# Patient Record
Sex: Female | Born: 1965 | Race: Black or African American | Hispanic: No | Marital: Single | Smoking: Never smoker
Health system: Southern US, Community
[De-identification: ages and names within clinical notes are randomized; demographics above are authoritative.]

## PROBLEM LIST (undated history)

## (undated) DIAGNOSIS — I10 Essential (primary) hypertension: Secondary | ICD-10-CM

## (undated) HISTORY — PX: TUBAL LIGATION: SHX77

## (undated) HISTORY — PX: APPENDECTOMY: SHX54

## (undated) HISTORY — PX: OTHER SURGICAL HISTORY: SHX169

---

## 2010-05-13 ENCOUNTER — Observation Stay: Payer: Self-pay | Admitting: Internal Medicine

## 2010-07-21 ENCOUNTER — Emergency Department (HOSPITAL_COMMUNITY): Payer: Medicaid Other

## 2010-07-21 ENCOUNTER — Emergency Department (HOSPITAL_COMMUNITY)
Admission: EM | Admit: 2010-07-21 | Discharge: 2010-07-21 | Disposition: A | Discharge status: Home / Self Care | Payer: Medicaid Other | Attending: Emergency Medicine | Admitting: Emergency Medicine

## 2010-07-21 DIAGNOSIS — Z7982 Long term (current) use of aspirin: Secondary | ICD-10-CM | POA: Insufficient documentation

## 2010-07-21 DIAGNOSIS — M79609 Pain in unspecified limb: Secondary | ICD-10-CM | POA: Insufficient documentation

## 2010-07-21 DIAGNOSIS — I10 Essential (primary) hypertension: Secondary | ICD-10-CM | POA: Insufficient documentation

## 2011-11-02 ENCOUNTER — Emergency Department: Payer: Self-pay | Admitting: Emergency Medicine

## 2011-11-02 LAB — COMPREHENSIVE METABOLIC PANEL
Alkaline Phosphatase: 142 U/L — ABNORMAL HIGH (ref 50–136)
BUN: 8 mg/dL (ref 7–18)
Bilirubin,Total: 0.4 mg/dL (ref 0.2–1.0)
Chloride: 107 mmol/L (ref 98–107)
Co2: 25 mmol/L (ref 21–32)
Creatinine: 0.84 mg/dL (ref 0.60–1.30)
Osmolality: 283 (ref 275–301)
SGPT (ALT): 25 U/L
Sodium: 142 mmol/L (ref 136–145)
Total Protein: 8.2 g/dL (ref 6.4–8.2)

## 2011-11-02 LAB — CBC WITH DIFFERENTIAL/PLATELET
Basophil #: 0.1 10*3/uL (ref 0.0–0.1)
Basophil %: 0.4 %
Eosinophil #: 0.6 10*3/uL (ref 0.0–0.7)
Eosinophil %: 5.3 %
HCT: 34.1 % — ABNORMAL LOW (ref 35.0–47.0)
HGB: 11.2 g/dL — ABNORMAL LOW (ref 12.0–16.0)
Lymphocyte %: 21.8 %
MCV: 73 fL — ABNORMAL LOW (ref 80–100)
Monocyte %: 6.6 %
Neutrophil #: 7.9 10*3/uL — ABNORMAL HIGH (ref 1.4–6.5)
Platelet: 505 10*3/uL — ABNORMAL HIGH (ref 150–440)
RBC: 4.65 10*6/uL (ref 3.80–5.20)
WBC: 12 10*3/uL — ABNORMAL HIGH (ref 3.6–11.0)

## 2012-05-25 ENCOUNTER — Emergency Department (HOSPITAL_COMMUNITY)
Admission: EM | Admit: 2012-05-25 | Discharge: 2012-05-25 | Disposition: A | Discharge status: Home / Self Care | Payer: Self-pay | Attending: Emergency Medicine | Admitting: Emergency Medicine

## 2012-05-25 ENCOUNTER — Encounter (HOSPITAL_COMMUNITY): Payer: Self-pay

## 2012-05-25 DIAGNOSIS — J029 Acute pharyngitis, unspecified: Secondary | ICD-10-CM | POA: Insufficient documentation

## 2012-05-25 DIAGNOSIS — Z79899 Other long term (current) drug therapy: Secondary | ICD-10-CM | POA: Insufficient documentation

## 2012-05-25 DIAGNOSIS — R51 Headache: Secondary | ICD-10-CM | POA: Insufficient documentation

## 2012-05-25 DIAGNOSIS — I10 Essential (primary) hypertension: Secondary | ICD-10-CM | POA: Insufficient documentation

## 2012-05-25 DIAGNOSIS — K122 Cellulitis and abscess of mouth: Secondary | ICD-10-CM | POA: Insufficient documentation

## 2012-05-25 DIAGNOSIS — Z7982 Long term (current) use of aspirin: Secondary | ICD-10-CM | POA: Insufficient documentation

## 2012-05-25 HISTORY — DX: Essential (primary) hypertension: I10

## 2012-05-25 MED ORDER — HYDROCHLOROTHIAZIDE 25 MG PO TABS
ORAL_TABLET | ORAL | Status: AC
  Filled 2012-05-25: qty 1

## 2012-05-25 MED ORDER — ACETAMINOPHEN 325 MG PO TABS
650.0000 mg | ORAL_TABLET | Freq: Once | ORAL | Status: AC
  Administered 2012-05-25: 650 mg via ORAL
  Filled 2012-05-25: qty 2

## 2012-05-25 MED ORDER — HYDROCHLOROTHIAZIDE 25 MG PO TABS
25.0000 mg | ORAL_TABLET | Freq: Every day | ORAL | Status: DC
  Administered 2012-05-25: 25 mg via ORAL
  Filled 2012-05-25 (×2): qty 1

## 2012-05-25 MED ORDER — LISINOPRIL 10 MG PO TABS
20.0000 mg | ORAL_TABLET | Freq: Once | ORAL | Status: AC
  Administered 2012-05-25: 20 mg via ORAL
  Filled 2012-05-25: qty 2

## 2012-05-25 MED ORDER — DYCLONINE HCL 3 MG MT LOZG: 1.0000 | LOZENGE | OROMUCOSAL | Status: DC | PRN

## 2012-05-25 MED ORDER — IBUPROFEN 800 MG PO TABS
800.0000 mg | ORAL_TABLET | Freq: Once | ORAL | Status: AC
  Administered 2012-05-25: 800 mg via ORAL
  Filled 2012-05-25: qty 1

## 2012-05-25 MED ORDER — CEFDINIR 300 MG PO CAPS: 300.0000 mg | ORAL_CAPSULE | Freq: Two times a day (BID) | ORAL | Status: DC

## 2012-05-25 MED ORDER — BENZOCAINE (TOPICAL) 20 % EX AERO
INHALATION_SPRAY | Freq: Four times a day (QID) | CUTANEOUS | Status: DC | PRN
  Administered 2012-05-25: 1 via OROMUCOSAL
  Filled 2012-05-25 (×2): qty 57

## 2012-05-25 MED ORDER — IBUPROFEN 600 MG PO TABS: 600.0000 mg | ORAL_TABLET | Freq: Four times a day (QID) | ORAL | Status: DC | PRN

## 2012-05-25 MED ORDER — LISINOPRIL-HYDROCHLOROTHIAZIDE 20-25 MG PO TABS: 1.0000 | ORAL_TABLET | Freq: Every day | ORAL | Status: DC

## 2012-05-25 MED ORDER — HYDROCODONE-ACETAMINOPHEN 5-325 MG PO TABS: 1.0000 | ORAL_TABLET | Freq: Four times a day (QID) | ORAL | Status: DC | PRN

## 2012-05-25 NOTE — ED Notes (Signed)
C/o headache above right eye - states it feels like headache she gets with her cycle - currently having her period

## 2012-05-25 NOTE — ED Notes (Signed)
Ambulatory to the bathroom - no assistance required.

## 2012-05-25 NOTE — ED Notes (Signed)
I woke up this morning and it felt like my throat was closing up. Throat is sore per pt.

## 2012-05-25 NOTE — ED Provider Notes (Signed)
History     CSN: [id number]  Arrival date & time 05/25/12  7672   First MD Initiated Contact with Patient 05/25/12 [phone]      Chief Complaint  Patient presents with  . Oral Swelling  . Sore Throat    (Consider location/radiation/quality/duration/timing/severity/associated sxs/prior treatment) HPI Maria Mullins is a 47 y.o. female with a past medical history significant for hypertension who presents to the ER with a sore throat and feeling of my throat is closing up." She said her throat started hurting yesterday, but then got better as the day wore on, she woke up early this morning about 3:00 and her pain was severe. It is worse when she swallows. She's not having any trouble breathing or trouble swallowing her saliva.  She's had no fever, no neck pain, no tongue swelling , she is not diabetic.  She is complaining about headache but she says this is the typical type of headache that she gets with her menstrual cycle which started yesterday. She denies any fevers, chills, has had no cough, abdominal pain, chest pain.  Past Medical History  Diagnosis Date  . Hypertension     Past Surgical History  Procedure Laterality Date  . Appendectomy      No family history on file.  History  Substance Use Topics  . Smoking status: Never Smoker   . Smokeless tobacco: Not on file  . Alcohol Use: No    OB History   Grav Para Term Preterm Abortions TAB SAB Ect Mult Living                  Review of Systems At least 10pt or greater review of systems completed and are negative except where specified in the HPI.  Allergies  Review of patient's allergies indicates no known allergies.  Home Medications   Current Outpatient Rx  Name  Route  Sig  Dispense  Refill  . aspirin 325 MG tablet   Oral   Take 325 mg by mouth daily.         Marland Kitchen lisinopril-hydrochlorothiazide (PRINZIDE,ZESTORETIC) 20-25 MG per tablet   Oral   Take 1 tablet by mouth daily.           BP 176/101   Pulse 97  Temp(Src) 98.4 F (36.9 C) (Oral)  Ht 5' 4"  (1.626 m)  Wt 211 lb (95.709 kg)  BMI 36.2 kg/m2  SpO2 98%  LMP 05/24/2012  Physical Exam  Nursing notes reviewed.  Electronic medical record reviewed. VITAL SIGNS:   Filed Vitals:   05/25/12 0521 05/25/12 0530 05/25/12 0548 05/25/12 0608  BP: 185/106 200/110 199/106 176/106  Pulse:   60   Temp:      TempSrc:      Resp:   16 16  Height:      Weight:      SpO2:   97%    CONSTITUTIONAL: Awake, oriented, appears non-toxic HENT: Atraumatic, normocephalic, oral mucosa pink and moist, airway patent. Tonsils are pink, without erythema or exudate. Uvula is erythematous mildly edematous, with a small blood spot on the tip. Nares patent without drainage. External ears normal. EYES: Conjunctiva clear, EOMI, PERRLA NECK: Trachea midline, non-tender, supple CARDIOVASCULAR: Normal heart rate, Normal rhythm, No murmurs, rubs, gallops PULMONARY/CHEST: Clear to auscultation, no rhonchi, wheezes, or rales. Symmetrical breath sounds. Non-tender. ABDOMINAL: Non-distended, obese, soft, non-tender - no rebound or guarding.  BS normal. NEUROLOGIC: Non-focal, moving all four extremities, no gross sensory or motor deficits. EXTREMITIES: No clubbing, cyanosis, or edema  SKIN: Warm, Dry, No erythema, No rash  ED Course  Procedures (including critical care time)  Labs Reviewed - No data to display No results found.   1. Uvulitis       MDM  Patient presents with uvulitis. I do not think she's got epiglottitis concurrently, she is nontoxic, afebrile and has no stridor. There are no abnormal breathing sounds. Applied Hurricaine spray to the uvula and a short burst (per RN) and the patient experienced almost immediate relief. We'll send patient home with some Hurricaine spray as she seems appropriate and cautioned her not to use it more than 4-5 times per day. Intermittently she may use Sucrets to alleviate her sore throat I prescribed her Cefdinir  in case of a Haemophilus or streptococcal infection of the uvula.  It does not appear that she's got any infection or involvement of the tonsils, pharynx and do not think she's got epiglottitis. I do not think a soft tissue x-ray is indicated at this time, she does not have a hoarse voice or hot potato voice, she has no drooling and is handling her secretions normally. No SOB. Patient is urged to return to the emergency department or call 911 immediately if she has any difficulties breathing, speaking, or shortness of breath.  She has no history of hemoglobinopathy, no allergy to fava beans and does not have a G6 PD deficiency.  I explained the diagnosis and have given explicit precautions to return to the ER including signs and symptoms consistent with epiglottitis or any other new or worsening symptoms. The patient understands and accepts the medical plan as it's been dictated and I have answered their questions. Discharge instructions concerning home care and prescriptions have been given.  The patient is STABLE and is discharged to home in good condition.    Rhunette Croft, MD 05/25/12 0900

## 2012-11-22 ENCOUNTER — Emergency Department (HOSPITAL_COMMUNITY)
Admission: EM | Admit: 2012-11-22 | Discharge: 2012-11-23 | Disposition: A | Discharge status: Home / Self Care | Payer: Self-pay | Attending: Emergency Medicine | Admitting: Emergency Medicine

## 2012-11-22 ENCOUNTER — Encounter (HOSPITAL_COMMUNITY): Payer: Self-pay | Admitting: *Deleted

## 2012-11-22 DIAGNOSIS — N390 Urinary tract infection, site not specified: Secondary | ICD-10-CM | POA: Insufficient documentation

## 2012-11-22 DIAGNOSIS — I10 Essential (primary) hypertension: Secondary | ICD-10-CM | POA: Insufficient documentation

## 2012-11-22 DIAGNOSIS — Z79899 Other long term (current) drug therapy: Secondary | ICD-10-CM | POA: Insufficient documentation

## 2012-11-22 DIAGNOSIS — R300 Dysuria: Secondary | ICD-10-CM | POA: Insufficient documentation

## 2012-11-22 DIAGNOSIS — Z7982 Long term (current) use of aspirin: Secondary | ICD-10-CM | POA: Insufficient documentation

## 2012-11-22 DIAGNOSIS — T783XXA Angioneurotic edema, initial encounter: Secondary | ICD-10-CM | POA: Insufficient documentation

## 2012-11-22 DIAGNOSIS — T4995XA Adverse effect of unspecified topical agent, initial encounter: Secondary | ICD-10-CM | POA: Insufficient documentation

## 2012-11-22 LAB — URINE MICROSCOPIC-ADD ON

## 2012-11-22 LAB — URINALYSIS, ROUTINE W REFLEX MICROSCOPIC
Bilirubin Urine: NEGATIVE
Glucose, UA: NEGATIVE mg/dL
Ketones, ur: NEGATIVE mg/dL
pH: 6 (ref 5.0–8.0)

## 2012-11-22 MED ORDER — DIPHENHYDRAMINE HCL 50 MG/ML IJ SOLN
25.0000 mg | Freq: Once | INTRAMUSCULAR | Status: AC
  Administered 2012-11-22: 25 mg via INTRAVENOUS
  Filled 2012-11-22: qty 1

## 2012-11-22 MED ORDER — METHYLPREDNISOLONE SODIUM SUCC 125 MG IJ SOLR
125.0000 mg | Freq: Once | INTRAMUSCULAR | Status: AC
  Administered 2012-11-22: 125 mg via INTRAVENOUS
  Filled 2012-11-22: qty 2

## 2012-11-22 MED ORDER — FAMOTIDINE 20 MG PO TABS
20.0000 mg | ORAL_TABLET | Freq: Once | ORAL | Status: AC
  Administered 2012-11-22: 20 mg via ORAL
  Filled 2012-11-22: qty 1

## 2012-11-22 MED ORDER — DEXTROSE 5 % IV SOLN
1.0000 g | Freq: Once | INTRAVENOUS | Status: AC
  Administered 2012-11-22: 1 g via INTRAVENOUS
  Filled 2012-11-22: qty 10

## 2012-11-22 NOTE — ED Notes (Signed)
Patient complaining of lower lip swelling starting today. States "When I woke up this morning, it was itching. Then it started swelling today." Patient also complains of a "knot" on the right side of her neck that she noticed this morning. No difficulty breathing or pain noted at this time.

## 2012-11-22 NOTE — ED Notes (Addendum)
Pt states her lower right side of her lip started itching and swelling this morning. Pt also states she feels a knot in her neck. (lymphnode swelling) Pt also c/o cloudy urine, dysuria, and states her urine has an "odor."

## 2012-11-22 NOTE — ED Provider Notes (Signed)
CSN: [id number]     Arrival date & time 11/22/12  2137 History  This chart was scribed for Teressa Lower, MD by Jenne Campus, ED Scribe and Eugenia Mcalpine, ED Scribe. This patient was seen in room APA01/APA01 and the patient's care was started at 11:11 PM.   Chief Complaint  Patient presents with  . Oral Swelling  . Dysuria    The history is provided by the patient. No language interpreter was used.   HPI Comments: Maria Mullins is a 47 y.o. female who presents to the Emergency Department complaining of gradual onset, constant, unchanging right lower lip swelling onset early this morning. The complaint orginally presented as itching but then progressed to include the swelling. She is currently on lisinopril for HTN for the past 3 to 4 years and she denies any prior episodes. Last dose was 24 hours ago. She denies any associated wheezing or rash.  She also c/o that she has had cloudy urine for the past 2 days with associated dysuria described as burning this morning. She reports similar episodes to prior UTIs. She denies any fevers or abdominal pain.  No PCP currently.  Past Medical History  Diagnosis Date  . Hypertension    Past Surgical History  Procedure Laterality Date  . Appendectomy     History reviewed. No pertinent family history. History  Substance Use Topics  . Smoking status: Never Smoker   . Smokeless tobacco: Not on file  . Alcohol Use: No   No OB history provided.  Review of Systems  Constitutional: Negative for fever.  HENT: Positive for facial swelling.   Gastrointestinal: Negative for nausea and abdominal pain.  Genitourinary: Positive for dysuria (cloudy urine and burning).  Musculoskeletal: Negative for back pain.  Skin: Negative for rash.  Neurological: Negative for headaches.  All other systems reviewed and are negative.    Allergies  Review of patient's allergies indicates no known allergies.  Home Medications   Current Outpatient Rx  Name   Route  Sig  Dispense  Refill  . aspirin 325 MG tablet   Oral   Take 325 mg by mouth daily.         . cefdinir (OMNICEF) 300 MG capsule   Oral   Take 1 capsule (300 mg total) by mouth 2 (two) times daily.   14 capsule   0   . Dyclonine HCl 3 MG LOZG   Mouth/Throat   Use as directed 1 lozenge (3 mg total) in the mouth or throat every 2 (two) hours as needed (sore throat).   18 each   0   . HYDROcodone-acetaminophen (NORCO/VICODIN) 5-325 MG per tablet   Oral   Take 1-2 tablets by mouth every 6 (six) hours as needed for pain.   13 tablet   0   . ibuprofen (ADVIL,MOTRIN) 600 MG tablet   Oral   Take 1 tablet (600 mg total) by mouth every 6 (six) hours as needed for pain.   30 tablet   0   . lisinopril-hydrochlorothiazide (PRINZIDE,ZESTORETIC) 20-25 MG per tablet   Oral   Take 1 tablet by mouth daily.         Marland Kitchen lisinopril-hydrochlorothiazide (PRINZIDE,ZESTORETIC) 20-25 MG per tablet   Oral   Take 1 tablet by mouth daily.   30 tablet   0    Triage Vitals: BP 188/115  Pulse 85  Temp(Src) 98.5 F (36.9 C) (Oral)  Resp 20  Ht 5' 4"  (1.626 m)  Wt 215 lb (97.523 kg)  BMI 36.89 kg/m2  SpO2 98%  LMP 11/09/2012  Physical Exam  Nursing note and vitals reviewed. Constitutional: She is oriented to person, place, and time. She appears well-developed and well-nourished.  HENT:  Head: Normocephalic and atraumatic.  Uvula midline Right lower lip and right pharyngeal edema  Eyes: EOM are normal.  Neck: Normal range of motion.  Cardiovascular: Normal rate and regular rhythm.   Pulmonary/Chest: Effort normal and breath sounds normal. No stridor. She has no wheezes.  Abdominal: Soft. There is no tenderness.  Musculoskeletal: Normal range of motion.  Neurological: She is alert and oriented to person, place, and time.  Skin: Skin is warm and dry. No rash noted.    ED Course   Medications  cefTRIAXone (ROCEPHIN) 1 g in dextrose 5 % 50 mL IVPB (1 g Intravenous New  Bag/Given 11/22/12 2335)  methylPREDNISolone sodium succinate (SOLU-MEDROL) 125 mg/2 mL injection 125 mg (125 mg Intravenous Given 11/22/12 2331)  diphenhydrAMINE (BENADRYL) injection 25 mg (25 mg Intravenous Given 11/22/12 2330)  famotidine (PEPCID) tablet 20 mg (20 mg Oral Given 11/22/12 2329)    DIAGNOSTIC STUDIES: Oxygen Saturation is 98% on room air, normal by my interpretation.    COORDINATION OF CARE: 11:16 PM-Discussed treatment plan which includes stopping the administration of the lisinopril and adding ACE inhibitors to her allergy list.  Pt will be placed on observation to ensure symptoms are improving with pt at bedside and pt agreed to plan. Will prescribe antibiotics for UTI upon discharge.   Procedures (including critical care time)  Labs Reviewed  URINALYSIS, ROUTINE W REFLEX MICROSCOPIC - Abnormal; Notable for the following:    APPearance CLOUDY (*)    Hgb urine dipstick MODERATE (*)    Protein, ur TRACE (*)    Nitrite POSITIVE (*)    Leukocytes, UA MODERATE (*)    All other components within normal limits  URINE MICROSCOPIC-ADD ON - Abnormal; Notable for the following:    Squamous Epithelial / LPF FEW (*)    Bacteria, UA MANY (*)    All other components within normal limits  URINE CULTURE   12:36 AM after IV ABx and IV medications, on recheck no progression of swelling, no change in symptoms.   3:39 AM angioedema improving. Patient requesting to be discharged home  Plan discharge home, stop lisinopril, followup primary care physician, take prednisone for the next 5 days. Strict return precautions verbalized as understood. Prescription for Keflex provided.  MDM  ACE inhibitor angioedema - steroids Benadryl and Pepcid provided Patient also has urinary tract infection - IV antibiotics provided  Urine culture pending  Vital signs and nursing notes reviewed and considered  I personally performed the services described in this documentation, which was scribed in my  presence. The recorded information has been reviewed and is accurate.     Teressa Lower, MD 11/23/12 [phone]

## 2012-11-23 MED ORDER — CEPHALEXIN 500 MG PO CAPS: 500.0000 mg | ORAL_CAPSULE | Freq: Four times a day (QID) | ORAL | Status: DC

## 2012-11-23 MED ORDER — PREDNISONE 20 MG PO TABS: 60.0000 mg | ORAL_TABLET | Freq: Every day | ORAL | Status: DC

## 2012-11-26 LAB — URINE CULTURE

## 2012-11-28 NOTE — ED Notes (Signed)
+   Urine Patient treated with Cephalexin-sensitive to same-chart appended per protocol MD. 

## 2013-09-05 ENCOUNTER — Ambulatory Visit: Payer: Self-pay | Admitting: Family Medicine

## 2014-01-28 ENCOUNTER — Emergency Department (HOSPITAL_COMMUNITY): Payer: BC Managed Care – PPO

## 2014-01-28 ENCOUNTER — Emergency Department (HOSPITAL_COMMUNITY)
Admission: EM | Admit: 2014-01-28 | Discharge: 2014-01-28 | Disposition: A | Discharge status: Home / Self Care | Payer: BC Managed Care – PPO | Attending: Emergency Medicine | Admitting: Emergency Medicine

## 2014-01-28 ENCOUNTER — Encounter (HOSPITAL_COMMUNITY): Payer: Self-pay | Admitting: Emergency Medicine

## 2014-01-28 DIAGNOSIS — R202 Paresthesia of skin: Secondary | ICD-10-CM | POA: Insufficient documentation

## 2014-01-28 DIAGNOSIS — R200 Anesthesia of skin: Secondary | ICD-10-CM | POA: Insufficient documentation

## 2014-01-28 DIAGNOSIS — Z8673 Personal history of transient ischemic attack (TIA), and cerebral infarction without residual deficits: Secondary | ICD-10-CM | POA: Diagnosis not present

## 2014-01-28 DIAGNOSIS — I10 Essential (primary) hypertension: Secondary | ICD-10-CM | POA: Insufficient documentation

## 2014-01-28 DIAGNOSIS — Z79899 Other long term (current) drug therapy: Secondary | ICD-10-CM | POA: Diagnosis not present

## 2014-01-28 LAB — CBC WITH DIFFERENTIAL/PLATELET
BASOS ABS: 0 10*3/uL (ref 0.0–0.1)
BASOS PCT: 0 % (ref 0–1)
EOS ABS: 0.5 10*3/uL (ref 0.0–0.7)
EOS PCT: 5 % (ref 0–5)
HEMATOCRIT: 39.2 % (ref 36.0–46.0)
Hemoglobin: 13.2 g/dL (ref 12.0–15.0)
Lymphocytes Relative: 30 % (ref 12–46)
Lymphs Abs: 3.4 10*3/uL (ref 0.7–4.0)
MCH: 28.9 pg (ref 26.0–34.0)
MCHC: 33.7 g/dL (ref 30.0–36.0)
MCV: 85.8 fL (ref 78.0–100.0)
MONO ABS: 0.7 10*3/uL (ref 0.1–1.0)
Monocytes Relative: 6 % (ref 3–12)
NEUTROS ABS: 6.5 10*3/uL (ref 1.7–7.7)
Neutrophils Relative %: 59 % (ref 43–77)
Platelets: 365 10*3/uL (ref 150–400)
RBC: 4.57 MIL/uL (ref 3.87–5.11)
RDW: 13.2 % (ref 11.5–15.5)
WBC: 11.1 10*3/uL — ABNORMAL HIGH (ref 4.0–10.5)

## 2014-01-28 LAB — COMPREHENSIVE METABOLIC PANEL
ALBUMIN: 4 g/dL (ref 3.5–5.2)
ALT: 61 U/L — ABNORMAL HIGH (ref 0–35)
ANION GAP: 13 (ref 5–15)
AST: 55 U/L — AB (ref 0–37)
Alkaline Phosphatase: 131 U/L — ABNORMAL HIGH (ref 39–117)
BUN: 7 mg/dL (ref 6–23)
CALCIUM: 10.6 mg/dL — AB (ref 8.4–10.5)
CHLORIDE: 100 meq/L (ref 96–112)
CO2: 27 mEq/L (ref 19–32)
CREATININE: 0.76 mg/dL (ref 0.50–1.10)
GFR calc Af Amer: >90 mL/min (ref >90)
GFR calc non Af Amer: >90 mL/min (ref >90)
Glucose, Bld: 96 mg/dL (ref 70–99)
Potassium: 3.4 mEq/L — ABNORMAL LOW (ref 3.7–5.3)
Sodium: 140 mEq/L (ref 137–147)
TOTAL PROTEIN: 7.7 g/dL (ref 6.0–8.3)
Total Bilirubin: 0.5 mg/dL (ref 0.3–1.2)

## 2014-01-28 LAB — I-STAT CHEM 8, ED
BUN: 4 mg/dL — ABNORMAL LOW (ref 6–23)
Calcium, Ion: 1.31 mmol/L — ABNORMAL HIGH (ref 1.12–1.23)
Chloride: 101 mEq/L (ref 96–112)
Creatinine, Ser: 0.7 mg/dL (ref 0.50–1.10)
GLUCOSE: 100 mg/dL — AB (ref 70–99)
HEMATOCRIT: 44 % (ref 36.0–46.0)
HEMOGLOBIN: 15 g/dL (ref 12.0–15.0)
POTASSIUM: 3 meq/L — AB (ref 3.7–5.3)
SODIUM: 140 meq/L (ref 137–147)
TCO2: 24 mmol/L (ref 0–100)

## 2014-01-28 LAB — I-STAT TROPONIN, ED: Troponin i, poc: 0 ng/mL (ref 0.00–0.08)

## 2014-01-28 LAB — TROPONIN I: Troponin I: <0.3 ng/mL (ref <0.30)

## 2014-01-28 MED ORDER — POTASSIUM CHLORIDE CRYS ER 20 MEQ PO TBCR
40.0000 meq | EXTENDED_RELEASE_TABLET | Freq: Once | ORAL | Status: AC
  Administered 2014-01-28: 40 meq via ORAL
  Filled 2014-01-28: qty 2

## 2014-01-28 MED ORDER — LABETALOL HCL 5 MG/ML IV SOLN
20.0000 mg | Freq: Once | INTRAVENOUS | Status: AC
  Administered 2014-01-28: 20 mg via INTRAVENOUS
  Filled 2014-01-28: qty 4

## 2014-01-28 MED ORDER — METOPROLOL TARTRATE 50 MG PO TABS: ORAL_TABLET | ORAL | Status: DC

## 2014-01-28 NOTE — Discharge Instructions (Signed)
Follow up with dr. Merlene Laughter in one week and follow up with your family md in one week.   Take one baby aspirin a day

## 2014-01-28 NOTE — ED Notes (Signed)
Pt resting quietly.  Respirations regular, even and unlabored.  No distress noted.

## 2014-01-28 NOTE — ED Provider Notes (Signed)
CSN: [id number]     Arrival date & time 01/28/14  1815 History  This chart was scribed for Maudry Diego, MD by Delphia Grates, ED Scribe. This patient was seen in room APA14/APA14 and the patient's care was started at 7:36 PM.      No chief complaint on file.   Patient is a 48 y.o. female presenting with weakness. The history is provided by the patient. No language interpreter was used.  Weakness This is a new problem. The current episode started 1 to 2 hours ago. The problem has not changed since onset.Pertinent negatives include no chest pain, no abdominal pain, no headaches and no shortness of breath. Nothing aggravates the symptoms. Nothing relieves the symptoms. She has tried nothing for the symptoms.    HPI Comments: Maria Mullins is a 48 y.o. female, with history of HTN, who presents to the Emergency Department complaining of sudden onset numbness to right side of body that began approximately 2.5 hours ago. Patient reports the numbness started in her right lower leg, and as time progressed, it spread up the right side of her body (to her right arm and right side of face). She states that she still feels numbness currently. Patient has history of TIA in 2010 with similar symptoms, but states she has been fine since. She is compliant with her BP medication (last dose this morning), however her BP at triage is 221/147.  Past Medical History  Diagnosis Date  . Hypertension    Past Surgical History  Procedure Laterality Date  . Appendectomy    . C sections    . Tubal ligation     History reviewed. No pertinent family history. History  Substance Use Topics  . Smoking status: Never Smoker   . Smokeless tobacco: Not on file  . Alcohol Use: No   OB History   Grav Para Term Preterm Abortions TAB SAB Ect Mult Living                 Review of Systems  Respiratory: Negative for shortness of breath.   Cardiovascular: Negative for chest pain.  Gastrointestinal: Negative  for abdominal pain.  Neurological: Positive for numbness. Negative for headaches.      Allergies  Lisinopril  Home Medications   Prior to Admission medications   Medication Sig Start Date End Date Taking? Authorizing Provider  losartan (COZAAR) 25 MG tablet Take 25 mg by mouth every morning.   Yes Historical Provider, MD   Triage Vitals: BP 199/131  Pulse 81  Temp(Src) 98.9 F (37.2 C) (Oral)  Resp 21  Ht 5' 4"  (1.626 m)  Wt 216 lb (97.977 kg)  BMI 37.06 kg/m2  SpO2 100%  LMP 01/17/2014  Physical Exam  Constitutional: She is oriented to person, place, and time. She appears well-developed.  HENT:  Head: Normocephalic.  Eyes: Conjunctivae and EOM are normal. No scleral icterus.  Neck: Neck supple. No thyromegaly present.  Cardiovascular: Normal rate and regular rhythm.  Exam reveals no gallop and no friction rub.   No murmur heard. Pulmonary/Chest: No stridor. She has no wheezes. She has no rales. She exhibits no tenderness.  Abdominal: She exhibits no distension. There is no tenderness. There is no rebound.  Musculoskeletal: Normal range of motion. She exhibits no edema.  Lymphadenopathy:    She has no cervical adenopathy.  Neurological: She is oriented to person, place, and time. She exhibits normal muscle tone. Coordination normal.  Mild decreased sensation in right leg, arm, and  cheek.  Skin: No rash noted. No erythema.  Psychiatric: She has a normal mood and affect. Her behavior is normal.    ED Course  Procedures (including critical care time)  DIAGNOSTIC STUDIES: Oxygen Saturation is 99% on room air, normal by my interpretation.    COORDINATION OF CARE: At 71 Discussed treatment plan with patient which includes imaging. Patient agrees.   Labs Review Labs Reviewed  CBC WITH DIFFERENTIAL - Abnormal; Notable for the following:    WBC 11.1 (*)    All other components within normal limits  COMPREHENSIVE METABOLIC PANEL - Abnormal; Notable for the  following:    Potassium 3.4 (*)    Calcium 10.6 (*)    AST 55 (*)    ALT 61 (*)    Alkaline Phosphatase 131 (*)    All other components within normal limits  I-STAT CHEM 8, ED - Abnormal; Notable for the following:    Potassium 3.0 (*)    BUN 4 (*)    Glucose, Bld 100 (*)    Calcium, Ion 1.31 (*)    All other components within normal limits  TROPONIN I  I-STAT TROPOININ, ED    Imaging Review Ct Head Wo Contrast  01/28/2014   CLINICAL DATA:  Numbness to right side of body since 1700 hr  EXAM: CT HEAD WITHOUT CONTRAST  TECHNIQUE: Contiguous axial images were obtained from the base of the skull through the vertex without intravenous contrast.  COMPARISON:  None.  FINDINGS: No acute cortical infarct, hemorrhage, or mass lesion ispresent. Ventricles are of normal size. No significant extra-axial fluid collection is present. The paranasal sinuses andmastoid air cells are clear. The osseous skull is intact.  IMPRESSION: Normal brain.   Electronically Signed   By: Kerby Moors M.D.   On: 01/28/2014 19:40   Mr Brain Wo Contrast  01/28/2014   CLINICAL DATA:  Acute onset right-sided numbness foot and leg.  EXAM: MRI HEAD WITHOUT CONTRAST  TECHNIQUE: Multiplanar, multiecho pulse sequences of the brain and surrounding structures were obtained without intravenous contrast.  COMPARISON:  CT head 01/28/2014  FINDINGS: Negative for acute or chronic infarction  Negative for demyelinating disease. Periventricular white matter intact. Scattered tiny white matter hyperintensities bilaterally nonspecific but likely related to chronic microvascular ischemia  Cerebral volume is normal. Ventricle size is normal. Craniocervical junction is normal. Pituitary is normal in size.  Negative for hemorrhage or fluid collection. Negative for mass or edema.  Mild mucosal edema in the paranasal sinuses.  IMPRESSION: No acute abnormality.  Several tiny white matter hyperintensities most likely due to chronic microvascular  ischemia or migraine headache. These lesions are not typical for multiple sclerosis. No acute infarct.   Electronically Signed   By: Franchot Gallo M.D.   On: 01/28/2014 20:53     EKG Interpretation   Date/Time:  Wednesday January 28 2014 18:30:04 EDT Ventricular Rate:  73 PR Interval:  159 QRS Duration: 90 QT Interval:  551 QTC Calculation: 607 R Axis:   32 Text Interpretation:  Sinus rhythm Abnormal R-wave progression, early  transition Probable left ventricular hypertrophy Prolonged QT interval  Confirmed by Emmamarie Kluender  MD, Gervase Colberg [phone]) on 01/28/2014 11:01:00 PM      MDM   Final diagnoses:  Numbness   The chart was scribed for me under my direct supervision.  I personally performed the history, physical, and medical decision making and all procedures in the evaluation of this patient.Maudry Diego, MD 01/28/14 [phone]

## 2014-01-28 NOTE — ED Notes (Signed)
Rt side of body feels numb since 5 pm.  Hx of TIA.   No slurred speech,

## 2014-04-01 DIAGNOSIS — I6381 Other cerebral infarction due to occlusion or stenosis of small artery: Secondary | ICD-10-CM | POA: Insufficient documentation

## 2014-04-01 DIAGNOSIS — I6322 Cerebral infarction due to unspecified occlusion or stenosis of basilar arteries: Secondary | ICD-10-CM

## 2014-04-01 DIAGNOSIS — I10 Essential (primary) hypertension: Secondary | ICD-10-CM | POA: Insufficient documentation

## 2014-04-01 DIAGNOSIS — E785 Hyperlipidemia, unspecified: Secondary | ICD-10-CM | POA: Insufficient documentation

## 2014-04-01 DIAGNOSIS — I63219 Cerebral infarction due to unspecified occlusion or stenosis of unspecified vertebral arteries: Secondary | ICD-10-CM | POA: Insufficient documentation

## 2014-05-08 ENCOUNTER — Emergency Department: Payer: Self-pay | Admitting: Internal Medicine

## 2014-07-29 DIAGNOSIS — E611 Iron deficiency: Secondary | ICD-10-CM | POA: Insufficient documentation

## 2015-07-11 ENCOUNTER — Encounter: Payer: Self-pay | Admitting: Emergency Medicine

## 2015-07-11 ENCOUNTER — Emergency Department: Payer: Self-pay

## 2015-07-11 ENCOUNTER — Emergency Department
Admission: EM | Admit: 2015-07-11 | Discharge: 2015-07-11 | Disposition: A | Discharge status: Home / Self Care | Payer: Self-pay | Attending: Emergency Medicine | Admitting: Emergency Medicine

## 2015-07-11 DIAGNOSIS — I10 Essential (primary) hypertension: Secondary | ICD-10-CM | POA: Insufficient documentation

## 2015-07-11 DIAGNOSIS — X58XXXD Exposure to other specified factors, subsequent encounter: Secondary | ICD-10-CM | POA: Insufficient documentation

## 2015-07-11 DIAGNOSIS — Y999 Unspecified external cause status: Secondary | ICD-10-CM | POA: Insufficient documentation

## 2015-07-11 DIAGNOSIS — Y929 Unspecified place or not applicable: Secondary | ICD-10-CM | POA: Insufficient documentation

## 2015-07-11 DIAGNOSIS — Y939 Activity, unspecified: Secondary | ICD-10-CM | POA: Insufficient documentation

## 2015-07-11 DIAGNOSIS — Z79899 Other long term (current) drug therapy: Secondary | ICD-10-CM | POA: Insufficient documentation

## 2015-07-11 DIAGNOSIS — S82431D Displaced oblique fracture of shaft of right fibula, subsequent encounter for closed fracture with routine healing: Secondary | ICD-10-CM | POA: Insufficient documentation

## 2015-07-11 DIAGNOSIS — S82401D Unspecified fracture of shaft of right fibula, subsequent encounter for closed fracture with routine healing: Secondary | ICD-10-CM

## 2015-07-11 MED ORDER — OXYCODONE-ACETAMINOPHEN 5-325 MG PO TABS
2.0000 | ORAL_TABLET | Freq: Once | ORAL | Status: AC
  Administered 2015-07-11: 2 via ORAL

## 2015-07-11 MED ORDER — OXYCODONE-ACETAMINOPHEN 5-325 MG PO TABS
ORAL_TABLET | ORAL | Status: AC
  Filled 2015-07-11: qty 2

## 2015-07-11 MED ORDER — OXYCODONE-ACETAMINOPHEN 5-325 MG PO TABS: 2.0000 | ORAL_TABLET | Freq: Four times a day (QID) | ORAL | Status: DC | PRN

## 2015-07-11 NOTE — Discharge Instructions (Signed)
Fibular Ankle Fracture Treated With or Without Immobilization, Adult A fibular fracture at your ankle is a break (fracture) bone in the smallest of the two bones in your lower leg, located on the outside of your leg (fibula) close to the area at your ankle joint. CAUSES  Rolling your ankle.  Twisting your ankle.  Extreme flexing or extending of your foot.  Severe force on your ankle as when falling from a distance. RISK FACTORS  Jumping activities.  Participation in sports.  Osteoporosis.  Advanced age.  Previous ankle injuries. SIGNS AND SYMPTOMS  Pain.  Swelling.  Inability to put weight on injured ankle.  Bruising.  Bone deformities at site of injury. DIAGNOSIS  This fracture is diagnosed with the help of an X-ray exam. TREATMENT  If the fractured bone did not move out of place it usually will heal without problems and does casting or splinting. If immobilization is needed for comfort or the fractured bone moved out of place and will not heal properly with immobilization, a cast or splint will be used. HOME CARE INSTRUCTIONS   Apply ice to the area of injury:  Put ice in a plastic bag.  Place a towel between your skin and the bag.  Leave the ice on for 20 minutes, 2-3 times a day.  Use crutches as directed. Resume walking without crutches as directed by your health care provider.  Only take over-the-counter or prescription medicines for pain, discomfort, or fever as directed by your health care provider.  If you have a removable splint or boot, do not remove the boot unless directed by your health care provider. SEEK MEDICAL CARE IF:   You have continued pain or more swelling  The medications do not control the pain. SEEK IMMEDIATE MEDICAL CARE IF:  You develop severe pain in the leg or foot.  Your skin or nails below the injury turn blue or grey or feel cold or numb. MAKE SURE YOU:   Understand these instructions.  Will watch your  condition.  Will get help right away if you are not doing well or get worse.   This information is not intended to replace advice given to you by your health care provider. Make sure you discuss any questions you have with your health care provider.   Document Released: 03/20/2005 Document Revised: 04/10/2014 Document Reviewed: 10/30/2012 Elsevier Interactive Patient Education [employer].

## 2015-07-11 NOTE — ED Notes (Signed)
Pt states approx 1 month ago she had a bad ankle sprain. States that she has continued pain and has started having "electric shocks" since yesterday. Pt ambulatory to triage.

## 2015-07-11 NOTE — ED Notes (Signed)
Pt alert and oriented X4, active, cooperative, pt in NAD. RR even and unlabored, color WNL.  Pt informed to return if any life threatening symptoms occur.   

## 2015-07-11 NOTE — ED Notes (Signed)
Ambulatory to room. Pt alert and oriented X4, active, cooperative, pt in NAD. RR even and unlabored, color WNL.

## 2015-07-11 NOTE — ED Notes (Signed)
MD at bedside. 

## 2015-07-11 NOTE — ED Provider Notes (Signed)
[facility] Emergency Department Provider Note     Time seen: ----------------------------------------- 5:59 PM on 07/11/2015 -----------------------------------------    I have reviewed the triage vital signs and the nursing notes.   HISTORY  Chief Complaint Ankle Pain    HPI Maria Mullins is a 50 y.o. female who presents ER for persistent right ankle pain. Patient states a month ago she had a bad ankle sprain, she continued having pain and is having electric shocklike pain over the outer aspect of right ankle. Walking makes it worse. Nothing has made it better.   Past Medical History  Diagnosis Date  . Hypertension     There are no active problems to display for this patient.   Past Surgical History  Procedure Laterality Date  . Appendectomy    . C sections    . Tubal ligation      Allergies Lisinopril  Social History Social History  Substance Use Topics  . Smoking status: Never Smoker   . Smokeless tobacco: None  . Alcohol Use: No    Review of Systems Constitutional: Negative for fever. Musculoskeletal: Positive for right ankle pain Skin: Negative for rash. Neurological: Negative for headaches, focal weakness or numbness. ____________________________________________   PHYSICAL EXAM:  VITAL SIGNS: ED Triage Vitals  Enc Vitals Group     BP 07/11/15 1729 179/102 mmHg     Pulse Rate 07/11/15 1729 62     Resp 07/11/15 1729 18     Temp 07/11/15 1729 97.8 F (36.6 C)     Temp Source 07/11/15 1729 Oral     SpO2 07/11/15 1729 99 %     Weight 07/11/15 1729 232 lb (105.235 kg)     Height 07/11/15 1729 5' 4"  (1.626 m)     Head Cir --      Peak Flow --      Pain Score 07/11/15 1729 7     Pain Loc --      Pain Edu? --      Excl. in [place]? --     Constitutional: Alert and oriented. Well appearing and in no distress. Eyes: Conjunctivae are normal. PERRL. Normal extraocular movements. Musculoskeletal: Pain around the right  ankle seems to be elicited by dorsiflexion of the ankle joint. No focal tenderness is noted. Neurologic:  Normal speech and language. No gross focal neurologic deficits are appreciated.  Skin:  Skin is warm, dry and intact. No rash noted. Psychiatric: Mood and affect are normal.  ___________________________________________  ED COURSE:  Pertinent labs & imaging results that were available during my care of the patient were reviewed by me and considered in my medical decision making (see chart for details). Patient is in no acute distress, will repeat x-rays and reevaluate. ____________________________________________   RADIOLOGY Images were viewed by me  Right ankle x-rays IMPRESSION: 1. Mildly displaced oblique fracture through the distal fibula. 2. Small osseous fragment anterior to the tibial plafond is likely degenerative in nature. ____________________________________________  FINAL ASSESSMENT AND PLAN  Distal fibular fracture  Plan: Patient with imaging as dictated above. Patient is no acute distress, I advise she would benefit from a walking boot to improve healing. I will refer her to orthopedics for follow-up.   Earleen Newport, MD   Earleen Newport, MD 07/11/15 [phone]

## 2015-11-05 ENCOUNTER — Ambulatory Visit: Payer: BLUE CROSS/BLUE SHIELD | Attending: Neurology

## 2015-11-05 DIAGNOSIS — G4733 Obstructive sleep apnea (adult) (pediatric): Secondary | ICD-10-CM | POA: Insufficient documentation

## 2015-11-05 DIAGNOSIS — I10 Essential (primary) hypertension: Secondary | ICD-10-CM | POA: Diagnosis present

## 2015-11-05 DIAGNOSIS — R0683 Snoring: Secondary | ICD-10-CM | POA: Diagnosis present

## 2015-12-17 ENCOUNTER — Encounter: Payer: Self-pay | Admitting: Emergency Medicine

## 2015-12-17 DIAGNOSIS — M7652 Patellar tendinitis, left knee: Secondary | ICD-10-CM | POA: Insufficient documentation

## 2015-12-17 DIAGNOSIS — I10 Essential (primary) hypertension: Secondary | ICD-10-CM | POA: Insufficient documentation

## 2015-12-17 DIAGNOSIS — M25562 Pain in left knee: Secondary | ICD-10-CM | POA: Diagnosis present

## 2015-12-17 NOTE — ED Triage Notes (Signed)
Patient states that she has had pain and swelling to her left knee that started on Sunday. Patient states that it " buckled" twice today. Patient denies any injury to her knee.

## 2015-12-18 ENCOUNTER — Emergency Department: Payer: BLUE CROSS/BLUE SHIELD

## 2015-12-18 ENCOUNTER — Emergency Department
Admission: EM | Admit: 2015-12-18 | Discharge: 2015-12-18 | Disposition: A | Discharge status: Home / Self Care | Payer: BLUE CROSS/BLUE SHIELD | Attending: Emergency Medicine | Admitting: Emergency Medicine

## 2015-12-18 DIAGNOSIS — M25562 Pain in left knee: Secondary | ICD-10-CM

## 2015-12-18 DIAGNOSIS — M7652 Patellar tendinitis, left knee: Secondary | ICD-10-CM

## 2015-12-18 MED ORDER — ETODOLAC 200 MG PO CAPS: 200.0000 mg | ORAL_CAPSULE | Freq: Three times a day (TID) | ORAL | 0 refills | Status: DC

## 2015-12-18 MED ORDER — TRAMADOL HCL 50 MG PO TABS
50.0000 mg | ORAL_TABLET | Freq: Once | ORAL | Status: AC
  Administered 2015-12-18: 50 mg via ORAL
  Filled 2015-12-18: qty 1

## 2015-12-18 MED ORDER — TRAMADOL HCL 50 MG PO TABS: 50.0000 mg | ORAL_TABLET | Freq: Four times a day (QID) | ORAL | 0 refills | Status: DC | PRN

## 2015-12-18 MED ORDER — KETOROLAC TROMETHAMINE 60 MG/2ML IM SOLN
60.0000 mg | Freq: Once | INTRAMUSCULAR | Status: AC
  Administered 2015-12-18: 60 mg via INTRAMUSCULAR
  Filled 2015-12-18: qty 2

## 2015-12-18 MED ORDER — LIDOCAINE 5 % EX PTCH
1.0000 | MEDICATED_PATCH | Freq: Two times a day (BID) | CUTANEOUS | 0 refills | Status: DC
Start: 2015-12-18 — End: 2016-11-26

## 2015-12-18 MED ORDER — LIDOCAINE 5 % EX PTCH
1.0000 | MEDICATED_PATCH | CUTANEOUS | Status: DC
  Administered 2015-12-18: 1 via TRANSDERMAL
  Filled 2015-12-18: qty 1

## 2015-12-18 NOTE — ED Notes (Signed)
Pt states left anterior knee pain around patella since this am. Pt with negative homan's sign. Pt denies other issues. Pt states pain is worse with movement or ambulation. Cms intact to left toes. No swelling or redness noted to left knee.

## 2015-12-18 NOTE — ED Provider Notes (Signed)
[facility] Emergency Department Provider Note   ____________________________________________   First MD Initiated Contact with Patient 12/18/15 [phone]     (approximate)  I have reviewed the triage vital signs and the nursing notes.   HISTORY  Chief Complaint Knee Pain    HPI Maria Mullins is a 50 y.o. female who comes into the hospital today with some left knee pain. The patient reports that her knee started bothering her on Sunday. She reports it is been bothering her on and off all week but today her knee buckled 3 times underneath her. She reports the pain is above her left patella. The patient rates her pain 8 out of 10 in intensity. She reports that given that her knee was buckling she wanted to get it checked out. She's been taking ibuprofen and Tylenol for pain. She's never had pain like this before denies any trauma and denies any swelling to her knee. The patient is here for evaluation.   Past Medical History:  Diagnosis Date  . Hypertension     There are no active problems to display for this patient.   Past Surgical History:  Procedure Laterality Date  . APPENDECTOMY    . c sections    . TUBAL LIGATION      Prior to Admission medications   Medication Sig Start Date End Date Taking? Authorizing Provider  etodolac (LODINE) 200 MG capsule Take 1 capsule (200 mg total) by mouth every 8 (eight) hours. 12/18/15   Loney Hering, MD  lidocaine (LIDODERM) 5 % Place 1 patch onto the skin every 12 (twelve) hours. Remove & Discard patch within 12 hours or as directed by MD 12/18/15 12/17/16  Loney Hering, MD  losartan (COZAAR) 25 MG tablet Take 25 mg by mouth every morning.    Historical Provider, MD  metoprolol (LOPRESSOR) 50 MG tablet Take one pill each day 01/28/14   Milton Ferguson, MD  oxyCODONE-acetaminophen (PERCOCET) 5-325 MG tablet Take 2 tablets by mouth every 6 (six) hours as needed for moderate pain or severe pain. 07/11/15    Earleen Newport, MD  traMADol (ULTRAM) 50 MG tablet Take 1 tablet (50 mg total) by mouth every 6 (six) hours as needed. 12/18/15   Loney Hering, MD    Allergies Lisinopril  No family history on file.  Social History Social History  Substance Use Topics  . Smoking status: Never Smoker  . Smokeless tobacco: Never Used  . Alcohol use No    Review of Systems Constitutional: No fever/chills Eyes: No visual changes. ENT: No sore throat. Cardiovascular: Denies chest pain. Respiratory: Denies shortness of breath. Gastrointestinal: No abdominal pain.  No nausea, no vomiting.  No diarrhea.  No constipation. Genitourinary: Negative for dysuria. Musculoskeletal: left knee pain  Skin: Negative for rash. Neurological: Negative for headaches, focal weakness or numbness.  10-point ROS otherwise negative.  ____________________________________________   PHYSICAL EXAM:  VITAL SIGNS: ED Triage Vitals [12/17/15 2353]  Enc Vitals Group     BP (!) 154/94     Pulse Rate 65     Resp 18     Temp 98.1 F (36.7 C)     Temp Source Oral     SpO2 99 %     Weight 228 lb (103.4 kg)     Height 5' 4"  (1.626 m)     Head Circumference      Peak Flow      Pain Score 8     Pain  Loc      Pain Edu?      Excl. in [place]?     Constitutional: Alert and oriented. Well appearing and in Mild distress. Eyes: Conjunctivae are normal. PERRL. EOMI. Head: Atraumatic. Nose: No congestion/rhinnorhea. Mouth/Throat: Mucous membranes are moist.  Oropharynx non-erythematous. Cardiovascular: Normal rate, regular rhythm. Grossly normal heart sounds.  Good peripheral circulation. Respiratory: Normal respiratory effort.  No retractions. Lungs CTAB. Gastrointestinal: Soft and nontender. No distention. positive bowel sounds Musculoskeletal: Left knee tenderness to palpation with flexion, tenderness palpation above the patella, positive valgus and varus stress pain   Neurologic:  Normal speech and language.  Positive bowel sounds Skin:  Skin is warm, dry and intact.  Psychiatric: Mood and affect are normal.   ____________________________________________   LABS (all labs ordered are listed, but only abnormal results are displayed)  Labs Reviewed - No data to display ____________________________________________  EKG  None ____________________________________________  RADIOLOGY  Left knee x-ray ____________________________________________   PROCEDURES  Procedure(s) performed: None  Procedures  Critical Care performed: No  ____________________________________________   INITIAL IMPRESSION / ASSESSMENT AND PLAN / ED COURSE  Pertinent labs & imaging results that were available during my care of the patient were reviewed by me and considered in my medical decision making (see chart for details).  This is a 50 year old female who comes into the hospital today with some left knee pain. The patient has been having this pain all week. The patient reports that the medicine that she is taking at home is not helping. The patient's x-ray is unremarkable but she may have some patellar tendinitis. I will give the patient some anti-inflammatories as well as some medicine for pain. I will have the patient follow up with orthopedic surgery for further evaluation. Otherwise the patient has no further actions or concerns. She'll be discharged home.  Clinical Course  Value Comment By Time  DG Knee Complete 4 Views Left No acute osseous abnormality about the knee. Loney Hering, MD 09/16 [phone]     ____________________________________________   FINAL CLINICAL IMPRESSION(S) / ED DIAGNOSES  Final diagnoses:  Left knee pain  Patellar tendonitis, left      NEW MEDICATIONS STARTED DURING THIS VISIT:  New Prescriptions   ETODOLAC (LODINE) 200 MG CAPSULE    Take 1 capsule (200 mg total) by mouth every 8 (eight) hours.   LIDOCAINE (LIDODERM) 5 %    Place 1 patch onto the skin every 12  (twelve) hours. Remove & Discard patch within 12 hours or as directed by MD   TRAMADOL (ULTRAM) 50 MG TABLET    Take 1 tablet (50 mg total) by mouth every 6 (six) hours as needed.     Note:  This document was prepared using Dragon voice recognition software and may include unintentional dictation errors.    Loney Hering, MD 12/18/15 0500

## 2016-11-25 ENCOUNTER — Encounter: Payer: Self-pay | Admitting: Emergency Medicine

## 2016-11-25 ENCOUNTER — Emergency Department
Admission: EM | Admit: 2016-11-25 | Discharge: 2016-11-26 | Disposition: A | Discharge status: Home / Self Care | Payer: BLUE CROSS/BLUE SHIELD | Attending: Emergency Medicine | Admitting: Emergency Medicine

## 2016-11-25 DIAGNOSIS — R040 Epistaxis: Secondary | ICD-10-CM | POA: Insufficient documentation

## 2016-11-25 DIAGNOSIS — I10 Essential (primary) hypertension: Secondary | ICD-10-CM | POA: Insufficient documentation

## 2016-11-25 DIAGNOSIS — Z7982 Long term (current) use of aspirin: Secondary | ICD-10-CM | POA: Insufficient documentation

## 2016-11-25 DIAGNOSIS — Z79899 Other long term (current) drug therapy: Secondary | ICD-10-CM | POA: Insufficient documentation

## 2016-11-25 NOTE — ED Triage Notes (Signed)
Pt presented to ED with spontaneous nosebleed from left nostril; also bleeding from the inner corner of left eye; pt with history of similar episode that required interventions; pt hypertensive in triage; clamp has been placed already; pt denies any pain

## 2016-11-26 LAB — COMPREHENSIVE METABOLIC PANEL
ALT: 54 U/L (ref 14–54)
AST: 60 U/L — ABNORMAL HIGH (ref 15–41)
Albumin: 4.1 g/dL (ref 3.5–5.0)
Alkaline Phosphatase: 152 U/L — ABNORMAL HIGH (ref 38–126)
Anion gap: 7 (ref 5–15)
BUN: 10 mg/dL (ref 6–20)
CHLORIDE: 108 mmol/L (ref 101–111)
CO2: 27 mmol/L (ref 22–32)
Calcium: 12.1 mg/dL — ABNORMAL HIGH (ref 8.9–10.3)
Creatinine, Ser: 0.95 mg/dL (ref 0.44–1.00)
GFR calc Af Amer: >60 mL/min (ref >60)
Glucose, Bld: 142 mg/dL — ABNORMAL HIGH (ref 65–99)
Potassium: 2.8 mmol/L — ABNORMAL LOW (ref 3.5–5.1)
SODIUM: 142 mmol/L (ref 135–145)
Total Bilirubin: 0.4 mg/dL (ref 0.3–1.2)
Total Protein: 7.6 g/dL (ref 6.5–8.1)

## 2016-11-26 LAB — CBC
HCT: 32.5 % — ABNORMAL LOW (ref 35.0–47.0)
Hemoglobin: 10.5 g/dL — ABNORMAL LOW (ref 12.0–16.0)
MCH: 21.7 pg — ABNORMAL LOW (ref 26.0–34.0)
MCHC: 32.2 g/dL (ref 32.0–36.0)
MCV: 67.5 fL — ABNORMAL LOW (ref 80.0–100.0)
PLATELETS: 466 10*3/uL — AB (ref 150–440)
RBC: 4.82 MIL/uL (ref 3.80–5.20)
RDW: 18.4 % — ABNORMAL HIGH (ref 11.5–14.5)
WBC: 11.6 10*3/uL — ABNORMAL HIGH (ref 3.6–11.0)

## 2016-11-26 LAB — PROTIME-INR
INR: 0.93
Prothrombin Time: 12.5 seconds (ref 11.4–15.2)

## 2016-11-26 MED ORDER — CLONIDINE HCL 0.1 MG PO TABS
0.1000 mg | ORAL_TABLET | Freq: Once | ORAL | Status: AC
  Administered 2016-11-26: 0.1 mg via ORAL

## 2016-11-26 MED ORDER — OXYMETAZOLINE HCL 0.05 % NA SOLN
NASAL | Status: AC
  Filled 2016-11-26: qty 15

## 2016-11-26 MED ORDER — OXYMETAZOLINE HCL 0.05 % NA SOLN
1.0000 | Freq: Once | NASAL | Status: AC
  Administered 2016-11-26: 1 via NASAL

## 2016-11-26 MED ORDER — POTASSIUM CHLORIDE 20 MEQ PO PACK
40.0000 meq | PACK | Freq: Two times a day (BID) | ORAL | Status: DC
  Administered 2016-11-26: 40 meq via ORAL
  Filled 2016-11-26: qty 2

## 2016-11-26 MED ORDER — CLONIDINE HCL 0.1 MG PO TABS
ORAL_TABLET | ORAL | Status: AC
  Filled 2016-11-26: qty 1

## 2016-11-26 NOTE — ED Provider Notes (Signed)
[facility] Emergency Department Provider Note   First MD Initiated Contact with Patient 11/26/16 0019     (approximate)  I have reviewed the triage vital signs and the nursing notes.   HISTORY  Chief Complaint Epistaxis and Hypertension    HPI Maria Mullins is a 51 y.o. female with history of hypertension and previous nosebleed presents to the emergency department with acute onset of epistaxis from the left nostril approximately 1 hour before presentation. Patient admits to previous history of the same. Patient denies any discomfort. Patient noted to be markedly hypertensive in triage with a blood pressure 215/127.   Past Medical History:  Diagnosis Date  . Hypertension     There are no active problems to display for this patient.   Past Surgical History:  Procedure Laterality Date  . APPENDECTOMY    . c sections    . TUBAL LIGATION      Prior to Admission medications   Medication Sig Start Date End Date Taking? Authorizing Provider  aspirin EC 81 MG tablet Take 81 mg by mouth daily.   Yes [provider]  ATORVASTATIN CALCIUM PO Take 1 tablet by mouth daily.   Yes [provider]  ferrous sulfate 325 (65 FE) MG tablet Take 325 mg by mouth daily with breakfast.   Yes [provider]  losartan (COZAAR) 25 MG tablet Take 25 mg by mouth every morning.   Yes [provider]  METOPROLOL TARTRATE PO Take 1 tablet by mouth daily.   Yes [provider]    Allergies Lisinopril  History reviewed. No pertinent family history.  Social History Social History  Substance Use Topics  . Smoking status: Never Smoker  . Smokeless tobacco: Never Used  . Alcohol use No    Review of Systems Constitutional: No fever/chills Eyes: No visual changes. ENT: No sore throat.Positive for left epistaxis Cardiovascular: Denies chest pain. Respiratory: Denies shortness of breath. Gastrointestinal: No abdominal  pain.  No nausea, no vomiting.  No diarrhea.  No constipation. Genitourinary: Negative for dysuria. Musculoskeletal: Negative for neck pain.  Negative for back pain. Integumentary: Negative for rash. Neurological: Negative for headaches, focal weakness or numbness.  ____________________________________________   PHYSICAL EXAM:  VITAL SIGNS: ED Triage Vitals  Enc Vitals Group     BP 11/25/16 2349 (!) 215/127     Pulse Rate 11/25/16 2349 (!) 116     Resp 11/25/16 2349 (!) 22     Temp 11/25/16 2349 97.7 F (36.5 C)     Temp Source 11/25/16 2349 Oral     SpO2 11/25/16 2349 97 %     Weight 11/25/16 2350 97.1 kg (214 lb)     Height 11/25/16 2350 1.626 m (5' 4" )     Head Circumference --      Peak Flow --      Pain Score 11/25/16 2349 0     Pain Loc --      Pain Edu? --      Excl. in [place]? --     Constitutional: Alert and oriented. Well appearing and in no acute distress. Eyes: Conjunctivae are normal.  Head: Atraumatic. Nose:Actively bleeding from the left nostril. Anterior nasal septum evidence of bleeding Mouth/Throat: Mucous membranes are moist.  Oropharynx non-erythematous. Neck: No stridor.  Cardiovascular: Normal rate, regular rhythm. Good peripheral circulation. Grossly normal heart sounds. Respiratory: Normal respiratory effort.  No retractions. Lungs CTAB. Gastrointestinal: Soft and nontender. No distention.  Musculoskeletal: No lower extremity tenderness  nor edema. No gross deformities of extremities. Neurologic:  Normal speech and language. No gross focal neurologic deficits are appreciated.  Skin:  Skin is warm, dry and intact. No rash noted. Psychiatric: Mood and affect are normal. Speech and behavior are normal.  ____________________________________________   LABS (all labs ordered are listed, but only abnormal results are displayed)  Labs Reviewed  CBC - Abnormal; Notable for the following:       Result Value   WBC 11.6 (*)    Hemoglobin 10.5 (*)    HCT  32.5 (*)    MCV 67.5 (*)    MCH 21.7 (*)    RDW 18.4 (*)    Platelets 466 (*)    All other components within normal limits  COMPREHENSIVE METABOLIC PANEL - Abnormal; Notable for the following:    Potassium 2.8 (*)    Glucose, Bld 142 (*)    Calcium 12.1 (*)    AST 60 (*)    Alkaline Phosphatase 152 (*)    All other components within normal limits  PROTIME-INR     .Epistaxis Management Date/Time: 11/26/2016 2:50 AM Performed by: Gregor Hams Authorized by: Gregor Hams   Consent:    Consent obtained:  Verbal   Risks discussed:  Pain and infection   Alternatives discussed:  Alternative treatment Anesthesia (see MAR for exact dosages):    Anesthesia method:  None Procedure details:    Treatment method:  Merocel sponge   Treatment complexity:  Limited   Treatment episode: initial   Post-procedure details:    Assessment:  Bleeding stopped   Patient tolerance of procedure:  Tolerated well, no immediate complications     ____________________________________________   INITIAL IMPRESSION / ASSESSMENT AND PLAN / ED COURSE  Pertinent labs & imaging results that were available during my care of the patient were reviewed by me and considered in my medical decision making (see chart for details).  Epistaxis completely resolved following Merosel sponge introduction. Patient will be referred to Dr. Tami Ribas ENT on call. Patient is advised to leave the Merocel in for 5 days until appointment with ENT      ____________________________________________  FINAL CLINICAL IMPRESSION(S) / ED DIAGNOSES  Final diagnoses:  Left-sided epistaxis     MEDICATIONS GIVEN DURING THIS VISIT:  Medications  potassium chloride (KLOR-CON) packet 40 mEq (40 mEq Oral Given 11/26/16 0210)  oxymetazoline (AFRIN) 0.05 % nasal spray (not administered)  cloNIDine (CATAPRES) tablet 0.1 mg (0.1 mg Oral Given 11/26/16 0043)  oxymetazoline (AFRIN) 0.05 % nasal spray 1 spray (1 spray Each Nare  Given 11/26/16 0045)     NEW OUTPATIENT MEDICATIONS STARTED DURING THIS VISIT:  New Prescriptions   No medications on file    Modified Medications   No medications on file    Discontinued Medications   ETODOLAC (LODINE) 200 MG CAPSULE    Take 1 capsule (200 mg total) by mouth every 8 (eight) hours.   LIDOCAINE (LIDODERM) 5 %    Place 1 patch onto the skin every 12 (twelve) hours. Remove & Discard patch within 12 hours or as directed by MD   METOPROLOL (LOPRESSOR) 50 MG TABLET    Take one pill each day   OXYCODONE-ACETAMINOPHEN (PERCOCET) 5-325 MG TABLET    Take 2 tablets by mouth every 6 (six) hours as needed for moderate pain or severe pain.   TRAMADOL (ULTRAM) 50 MG TABLET    Take 1 tablet (50 mg total) by mouth every 6 (six) hours as needed.  Note:  This document was prepared using Dragon voice recognition software and may include unintentional dictation errors.    Gregor Hams, MD 11/26/16 [phone]

## 2016-11-26 NOTE — ED Notes (Signed)
Patient reports decreased epistaxis.

## 2016-11-26 NOTE — ED Notes (Signed)
Reviewed d/c instructions, follow-up care, medications, with patient. Pt verbalized understanding.

## 2017-05-30 ENCOUNTER — Other Ambulatory Visit: Payer: Self-pay | Admitting: Nurse Practitioner

## 2017-05-30 DIAGNOSIS — Z1239 Encounter for other screening for malignant neoplasm of breast: Secondary | ICD-10-CM

## 2017-06-06 ENCOUNTER — Encounter: Payer: Self-pay | Admitting: Nurse Practitioner

## 2017-06-08 ENCOUNTER — Telehealth: Payer: Self-pay

## 2017-06-08 ENCOUNTER — Other Ambulatory Visit: Payer: Self-pay

## 2017-06-08 DIAGNOSIS — Z1211 Encounter for screening for malignant neoplasm of colon: Secondary | ICD-10-CM

## 2017-06-08 MED ORDER — NA SULFATE-K SULFATE-MG SULF 17.5-3.13-1.6 GM/177ML PO SOLN: 1.0000 | Freq: Once | ORAL | 0 refills | Status: AC

## 2017-06-08 NOTE — Telephone Encounter (Signed)
Gastroenterology Pre-Procedure Review  Request Date: 06/08/17 Requesting Physician: Dr. Marius Ditch  PATIENT REVIEW QUESTIONS: The patient responded to the following health history questions as indicated:    1. Are you having any GI issues? yes (Frequent emptying, mucous in stool) 2. Do you have a personal history of Polyps? no 3. Do you have a family history of Colon Cancer or Polyps? no 4. Diabetes Mellitus? no 5. Joint replacements in the past 12 months?no 6. Major health problems in the past 3 months?no 7. Any artificial heart valves, MVP, or defibrillator?no    MEDICATIONS & ALLERGIES:    Patient reports the following regarding taking any anticoagulation/antiplatelet therapy:   Plavix, Coumadin, Eliquis, Xarelto, Lovenox, Pradaxa, Brilinta, or Effient? no Aspirin? yes (81 mg daily)  Patient confirms/reports the following medications:  Current Outpatient Medications  Medication Sig Dispense Refill  . aspirin EC 81 MG tablet Take 81 mg by mouth daily.    . ATORVASTATIN CALCIUM PO Take 1 tablet by mouth daily.    . ferrous sulfate 325 (65 FE) MG tablet Take 325 mg by mouth daily with breakfast.    . losartan (COZAAR) 25 MG tablet Take 25 mg by mouth every morning.    Marland Kitchen METOPROLOL TARTRATE PO Take 1 tablet by mouth daily.    . Na Sulfate-K Sulfate-Mg Sulf 17.5-3.13-1.6 GM/177ML SOLN Take 1 kit by mouth once for 1 dose. 354 mL 0   No current facility-administered medications for this visit.     Patient confirms/reports the following allergies:  Allergies  Allergen Reactions  . Lisinopril Swelling    Swelling in throat    No orders of the defined types were placed in this encounter.   AUTHORIZATION INFORMATION Primary Insurance: 1D#: Group #:  Secondary Insurance: 1D#: Group #:  SCHEDULE INFORMATION: Date: 06/11/17 Time: Location:[facility]

## 2017-06-11 ENCOUNTER — Other Ambulatory Visit: Payer: Self-pay

## 2017-06-11 ENCOUNTER — Telehealth: Payer: Self-pay | Admitting: Gastroenterology

## 2017-06-11 ENCOUNTER — Encounter: Admission: RE | Payer: Self-pay | Source: Ambulatory Visit

## 2017-06-11 ENCOUNTER — Ambulatory Visit
Admission: RE | Admit: 2017-06-11 | Payer: BLUE CROSS/BLUE SHIELD | Source: Ambulatory Visit | Admitting: Gastroenterology

## 2017-06-11 DIAGNOSIS — Z1211 Encounter for screening for malignant neoplasm of colon: Secondary | ICD-10-CM

## 2017-06-11 SURGERY — COLONOSCOPY WITH PROPOFOL
Anesthesia: General

## 2017-06-11 MED ORDER — PEG 3350-KCL-NA BICARB-NACL 420 G PO SOLR: 4000.0000 mL | Freq: Once | ORAL | 0 refills | Status: AC

## 2017-06-11 NOTE — Telephone Encounter (Signed)
Gastroenterology Pre-Procedure Review  Request Date: 06/19/17 Requesting Physician: Dr. Vicente Males    PATIENT REVIEW QUESTIONS: The patient responded to the following health history questions as indicated:    1. Are you having any GI issues? Yes, freq emptying, mucus in stools  2. Do you have a personal history of Polyps? No  3. Do you have a family history of Colon Cancer or Polyps? No  4. Diabetes Mellitus? No  5. Joint replacements in the past 12 months? No  6. Major health problems in the past 3 months? No  7. Any artificial heart valves, MVP, or defibrillator? No     MEDICATIONS & ALLERGIES:    Patient reports the following regarding taking any anticoagulation/antiplatelet therapy:   Plavix, Coumadin, Eliquis, Xarelto, Lovenox, Pradaxa, Brilinta, or Effient? No  Aspirin? Yes   Patient confirms/reports the following medications:  Current Outpatient Medications  Medication Sig Dispense Refill  . aspirin EC 81 MG tablet Take 81 mg by mouth daily.    . ATORVASTATIN CALCIUM PO Take 1 tablet by mouth daily.    . ferrous sulfate 325 (65 FE) MG tablet Take 325 mg by mouth daily with breakfast.    . losartan (COZAAR) 25 MG tablet Take 25 mg by mouth every morning.    Marland Kitchen METOPROLOL TARTRATE PO Take 1 tablet by mouth daily.    . polyethylene glycol-electrolytes (NULYTELY/GOLYTELY) 420 g solution Take 4,000 mLs by mouth once for 1 dose. 4000 mL 0   No current facility-administered medications for this visit.     Patient confirms/reports the following allergies:  Allergies  Allergen Reactions  . Lisinopril Swelling    Swelling in throat    No orders of the defined types were placed in this encounter.   AUTHORIZATION INFORMATION Primary Insurance: 1D#: Group #:  Secondary Insurance: 1D#: Group #:  SCHEDULE INFORMATION: Date: 06/19/17 Time: Location: [facility]

## 2017-06-11 NOTE — Telephone Encounter (Signed)
Please call patient to r/s procedure. She called after office hours on Friday. She can't afford the prep and needs generic prep.

## 2017-06-14 ENCOUNTER — Other Ambulatory Visit: Payer: Self-pay

## 2017-06-17 ENCOUNTER — Other Ambulatory Visit: Payer: Self-pay

## 2017-06-17 DIAGNOSIS — Z1211 Encounter for screening for malignant neoplasm of colon: Secondary | ICD-10-CM

## 2017-06-19 ENCOUNTER — Ambulatory Visit: Payer: BLUE CROSS/BLUE SHIELD | Admitting: Certified Registered Nurse Anesthetist

## 2017-06-19 ENCOUNTER — Ambulatory Visit
Admission: RE | Admit: 2017-06-19 | Discharge: 2017-06-19 | Disposition: A | Discharge status: Home / Self Care | Payer: BLUE CROSS/BLUE SHIELD | Source: Ambulatory Visit | Attending: Gastroenterology | Admitting: Gastroenterology

## 2017-06-19 ENCOUNTER — Encounter
Admission: RE | Disposition: A | Discharge status: Home / Self Care | Payer: Self-pay | Source: Ambulatory Visit | Attending: Gastroenterology

## 2017-06-19 ENCOUNTER — Encounter: Payer: Self-pay | Admitting: Anesthesiology

## 2017-06-19 DIAGNOSIS — Z8673 Personal history of transient ischemic attack (TIA), and cerebral infarction without residual deficits: Secondary | ICD-10-CM | POA: Diagnosis not present

## 2017-06-19 DIAGNOSIS — Z1211 Encounter for screening for malignant neoplasm of colon: Secondary | ICD-10-CM | POA: Diagnosis not present

## 2017-06-19 DIAGNOSIS — I10 Essential (primary) hypertension: Secondary | ICD-10-CM | POA: Diagnosis not present

## 2017-06-19 DIAGNOSIS — Z7982 Long term (current) use of aspirin: Secondary | ICD-10-CM | POA: Insufficient documentation

## 2017-06-19 DIAGNOSIS — E669 Obesity, unspecified: Secondary | ICD-10-CM | POA: Diagnosis not present

## 2017-06-19 DIAGNOSIS — Z79899 Other long term (current) drug therapy: Secondary | ICD-10-CM | POA: Insufficient documentation

## 2017-06-19 DIAGNOSIS — Z6836 Body mass index (BMI) 36.0-36.9, adult: Secondary | ICD-10-CM | POA: Diagnosis not present

## 2017-06-19 DIAGNOSIS — Z888 Allergy status to other drugs, medicaments and biological substances status: Secondary | ICD-10-CM | POA: Insufficient documentation

## 2017-06-19 DIAGNOSIS — K529 Noninfective gastroenteritis and colitis, unspecified: Secondary | ICD-10-CM | POA: Diagnosis not present

## 2017-06-19 HISTORY — PX: COLONOSCOPY WITH PROPOFOL: SHX5780

## 2017-06-19 LAB — POCT PREGNANCY, URINE: Preg Test, Ur: NEGATIVE

## 2017-06-19 SURGERY — COLONOSCOPY WITH PROPOFOL
Anesthesia: General

## 2017-06-19 MED ORDER — LIDOCAINE HCL (PF) 1 % IJ SOLN
2.0000 mL | Freq: Once | INTRAMUSCULAR | Status: AC
  Administered 2017-06-19: 0.3 mL via INTRADERMAL

## 2017-06-19 MED ORDER — PROPOFOL 10 MG/ML IV BOLUS
INTRAVENOUS | Status: DC | PRN
  Administered 2017-06-19: 20 mg via INTRAVENOUS
  Administered 2017-06-19: 70 mg via INTRAVENOUS

## 2017-06-19 MED ORDER — LIDOCAINE HCL (PF) 1 % IJ SOLN
INTRAMUSCULAR | Status: AC
  Administered 2017-06-19: 0.3 mL via INTRADERMAL
  Filled 2017-06-19: qty 2

## 2017-06-19 MED ORDER — SODIUM CHLORIDE 0.9 % IV SOLN
INTRAVENOUS | Status: DC
  Administered 2017-06-19: 1000 mL via INTRAVENOUS

## 2017-06-19 MED ORDER — PROPOFOL 500 MG/50ML IV EMUL
INTRAVENOUS | Status: DC | PRN
  Administered 2017-06-19: 150 ug/kg/min via INTRAVENOUS

## 2017-06-19 MED ORDER — PROPOFOL 500 MG/50ML IV EMUL
INTRAVENOUS | Status: AC
  Filled 2017-06-19: qty 50

## 2017-06-19 NOTE — Anesthesia Post-op Follow-up Note (Signed)
Anesthesia QCDR form completed.        

## 2017-06-19 NOTE — Op Note (Signed)
[facility] Patient Name: Maria Mullins Procedure Date: 06/19/2017 8:45 AM MRN: [id number] Account #: [id number] Date of Birth: 1965/05/15 Admit Type: Outpatient Age: 52 Room: [facility] ENDO ROOM 1 Gender: Female Note Status: Finalized Procedure:            Colonoscopy Indications:          Screening for colorectal malignant neoplasm Providers:            Jonathon Bellows MD, MD Referring MD:         No Local Md, MD (Referring MD) Medicines:            Monitored Anesthesia Care Complications:        No immediate complications. Procedure:            Pre-Anesthesia Assessment:                       - Prior to the procedure, a History and Physical was                        performed, and patient medications, allergies and                        sensitivities were reviewed. The patient's tolerance of                        previous anesthesia was reviewed.                       - The risks and benefits of the procedure and the                        sedation options and risks were discussed with the                        patient. All questions were answered and informed                        consent was obtained.                       - ASA Grade Assessment: II - A patient with mild                        systemic disease.                       After obtaining informed consent, the colonoscope was                        passed under direct vision. Throughout the procedure,                        the patient's blood pressure, pulse, and oxygen                        saturations were monitored continuously. The                        Colonoscope was introduced through the anus and  advanced to the the cecum, identified by the                        appendiceal orifice, IC valve and transillumination.                        The colonoscopy was performed with ease. The patient                        tolerated the procedure well. The  quality of the bowel                        preparation was good. Findings:      The perianal and digital rectal examinations were normal.      The mucosa vascular pattern in the descending colon and in the       transverse colon was diffusely decreased. This was biopsied with a cold       forceps for histology.      The exam was otherwise without abnormality on direct and retroflexion       views. Impression:           - Decreased mucosa vascular pattern in the descending                        colon and in the transverse colon. Biopsied.                       - The examination was otherwise normal on direct and                        retroflexion views. Recommendation:       - Discharge patient to home (with escort).                       - Resume previous diet.                       - Continue present medications.                       - Repeat colonoscopy in 10 years for screening purposes.                       - Await pathology results. Procedure Code(s):    --- Professional ---                       [phone], Colonoscopy, flexible; with biopsy, single or                        multiple Diagnosis Code(s):    --- Professional ---                       Z12.11, Encounter for screening for malignant neoplasm                        of colon CPT copyright 2016 American Medical Association. All rights reserved. The codes documented in this report are preliminary and upon coder review may  be revised to meet current compliance requirements. Jonathon Bellows, MD Jonathon Bellows MD, MD 06/19/2017 9:10:11 AM This report has been  signed electronically. Number of Addenda: 0 Note Initiated On: 06/19/2017 8:45 AM Scope Withdrawal Time: 0 hours 9 minutes 30 seconds  Total Procedure Duration: 0 hours 17 minutes 14 seconds       [facility]

## 2017-06-19 NOTE — Anesthesia Postprocedure Evaluation (Signed)
Anesthesia Post Note  Patient: Maria Mullins  Procedure(s) Performed: COLONOSCOPY WITH PROPOFOL (N/A )  Patient location during evaluation: Endoscopy Anesthesia Type: General Level of consciousness: awake and alert Pain management: pain level controlled Vital Signs Assessment: post-procedure vital signs reviewed and stable Respiratory status: spontaneous breathing, nonlabored ventilation, respiratory function stable and patient connected to nasal cannula oxygen Cardiovascular status: blood pressure returned to baseline and stable Postop Assessment: no apparent nausea or vomiting Anesthetic complications: no     Last Vitals:  Vitals:   06/19/17 0922 06/19/17 0932  BP: 132/87 121/77  Pulse: (!) 54 65  Resp: 19 19  Temp:    SpO2: 92% 97%    Last Pain:  Vitals:   06/19/17 0912  TempSrc: Tympanic                 Soua Lenk S

## 2017-06-19 NOTE — Anesthesia Procedure Notes (Signed)
Performed by: Demetrius Charity, CRNA Pre-anesthesia Checklist: Patient identified, Emergency Drugs available, Suction available, Patient being monitored and Timeout performed Patient Re-evaluated:Patient Re-evaluated prior to induction Oxygen Delivery Method: Nasal cannula Induction Type: IV induction

## 2017-06-19 NOTE — Transfer of Care (Signed)
Immediate Anesthesia Transfer of Care Note  Patient: Maria Mullins  Procedure(s) Performed: COLONOSCOPY WITH PROPOFOL (N/A )  Patient Location: PACU  Anesthesia Type:General  Level of Consciousness: sedated  Airway & Oxygen Therapy: Patient Spontanous Breathing and Patient connected to nasal cannula oxygen  Post-op Assessment: Report given to RN and Post -op Vital signs reviewed and stable  Post vital signs: Reviewed and stable  Last Vitals:  Vitals:   06/19/17 0823 06/19/17 0912  BP: (!) 165/102 118/83  Pulse: 60 (!) 57  Resp: 17 16  Temp: (!) 35.7 C (!) 36.1 C  SpO2: 99% 97%    Last Pain:  Vitals:   06/19/17 0912  TempSrc: Tympanic         Complications: No apparent anesthesia complications

## 2017-06-19 NOTE — H&P (Signed)
           Jonathon Bellows, MD [address] [place], [place], [place], [address] [address], [place], [place], [place], [address] Phone: [phone]  Fax: [phone]  Primary Care Physician:  Elisabeth Cara, NP   Pre-Procedure History & Physical: HPI:  Maria Mullins is a 52 y.o. female is here for an colonoscopy.   Past Medical History:  Diagnosis Date  . Hypertension     Past Surgical History:  Procedure Laterality Date  . APPENDECTOMY    . c sections    . TUBAL LIGATION      Prior to Admission medications   Medication Sig Start Date End Date Taking? Authorizing Provider  aspirin EC 81 MG tablet Take 81 mg by mouth daily.    [provider]  ATORVASTATIN CALCIUM PO Take 1 tablet by mouth daily.    [provider]  ferrous sulfate 325 (65 FE) MG tablet Take 325 mg by mouth daily with breakfast.    [provider]  losartan (COZAAR) 25 MG tablet Take 25 mg by mouth every morning.    [provider]  METOPROLOL TARTRATE PO Take 1 tablet by mouth daily.    [provider]    Allergies as of 06/18/2017 - Review Complete 11/26/2016  Allergen Reaction Noted  . Lisinopril Swelling 01/28/2014    History reviewed. No pertinent family history.  Social History   Socioeconomic History  . Marital status: Single    Spouse name: Not on file  . Number of children: Not on file  . Years of education: Not on file  . Highest education level: Not on file  Social Needs  . Financial resource strain: Not on file  . Food insecurity - worry: Not on file  . Food insecurity - inability: Not on file  . Transportation needs - medical: Not on file  . Transportation needs - non-medical: Not on file  Occupational History  . Not on file  Tobacco Use  . Smoking status: Never Smoker  . Smokeless tobacco: Never Used  Substance and Sexual Activity  . Alcohol use: No  . Drug use: No  . Sexual activity: Not on file  Other Topics  Concern  . Not on file  Social History Narrative  . Not on file    Review of Systems: See HPI, otherwise negative ROS  Physical Exam: There were no vitals taken for this visit. General:   Alert,  pleasant and cooperative in NAD Head:  Normocephalic and atraumatic. Neck:  Supple; no masses or thyromegaly. Lungs:  Clear throughout to auscultation, normal respiratory effort.    Heart:  +S1, +S2, Regular rate and rhythm, No edema. Abdomen:  Soft, nontender and nondistended. Normal bowel sounds, without guarding, and without rebound.   Neurologic:  Alert and  oriented x4;  grossly normal neurologically.  Impression/Plan: Maria Mullins is here for an colonoscopy to be performed for Screening colonoscopy average risk   Risks, benefits, limitations, and alternatives regarding  colonoscopy have been reviewed with the patient.  Questions have been answered.  All parties agreeable.   Jonathon Bellows, MD  06/19/2017, 8:21 AM

## 2017-06-19 NOTE — Anesthesia Preprocedure Evaluation (Addendum)
Anesthesia Evaluation  Patient identified by MRN, date of birth, ID band Patient awake    Reviewed: Allergy & Precautions, NPO status , Patient's Chart, lab work & pertinent test results, reviewed documented beta blocker date and time   Airway Mallampati: III  TM Distance: >3 FB     Dental  (+) Chipped   Pulmonary           Cardiovascular hypertension, Pt. on medications and Pt. on home beta blockers      Neuro/Psych CVA, No Residual Symptoms    GI/Hepatic   Endo/Other    Renal/GU      Musculoskeletal   Abdominal   Peds  Hematology   Anesthesia Other Findings Obese.  Reproductive/Obstetrics                            Anesthesia Physical Anesthesia Plan  ASA: III  Anesthesia Plan: General   Post-op Pain Management:    Induction: Intravenous  PONV Risk Score and Plan:   Airway Management Planned:   Additional Equipment:   Intra-op Plan:   Post-operative Plan:   Informed Consent: I have reviewed the patients History and Physical, chart, labs and discussed the procedure including the risks, benefits and alternatives for the proposed anesthesia with the patient or authorized representative who has indicated his/her understanding and acceptance.     Plan Discussed with: CRNA  Anesthesia Plan Comments:        Anesthesia Quick Evaluation

## 2017-06-20 ENCOUNTER — Encounter: Payer: Self-pay | Admitting: Gastroenterology

## 2017-06-21 LAB — SURGICAL PATHOLOGY

## 2017-06-25 ENCOUNTER — Inpatient Hospital Stay: Admission: RE | Admit: 2017-06-25 | Payer: BLUE CROSS/BLUE SHIELD | Source: Ambulatory Visit

## 2017-06-25 ENCOUNTER — Emergency Department
Admission: EM | Admit: 2017-06-25 | Discharge: 2017-06-25 | Disposition: A | Discharge status: Home / Self Care | Payer: BLUE CROSS/BLUE SHIELD | Attending: Emergency Medicine | Admitting: Emergency Medicine

## 2017-06-25 ENCOUNTER — Other Ambulatory Visit: Payer: Self-pay

## 2017-06-25 ENCOUNTER — Emergency Department: Payer: BLUE CROSS/BLUE SHIELD

## 2017-06-25 DIAGNOSIS — M25511 Pain in right shoulder: Secondary | ICD-10-CM | POA: Diagnosis present

## 2017-06-25 DIAGNOSIS — I10 Essential (primary) hypertension: Secondary | ICD-10-CM | POA: Insufficient documentation

## 2017-06-25 DIAGNOSIS — Z7982 Long term (current) use of aspirin: Secondary | ICD-10-CM | POA: Insufficient documentation

## 2017-06-25 DIAGNOSIS — Z79899 Other long term (current) drug therapy: Secondary | ICD-10-CM | POA: Insufficient documentation

## 2017-06-25 MED ORDER — LIDOCAINE 5 % EX PTCH
1.0000 | MEDICATED_PATCH | CUTANEOUS | Status: DC
  Administered 2017-06-25: 1 via TRANSDERMAL
  Filled 2017-06-25: qty 1

## 2017-06-25 MED ORDER — LIDOCAINE 5 % EX PTCH: 1.0000 | MEDICATED_PATCH | Freq: Two times a day (BID) | CUTANEOUS | 0 refills | Status: DC

## 2017-06-25 MED ORDER — KETOROLAC TROMETHAMINE 60 MG/2ML IM SOLN
60.0000 mg | Freq: Once | INTRAMUSCULAR | Status: AC
  Administered 2017-06-25: 60 mg via INTRAMUSCULAR
  Filled 2017-06-25: qty 2

## 2017-06-25 MED ORDER — TRAMADOL HCL 50 MG PO TABS: 50.0000 mg | ORAL_TABLET | Freq: Four times a day (QID) | ORAL | 0 refills | Status: DC | PRN

## 2017-06-25 MED ORDER — DIAZEPAM 5 MG PO TABS
5.0000 mg | ORAL_TABLET | Freq: Once | ORAL | Status: AC
  Administered 2017-06-25: 5 mg via ORAL
  Filled 2017-06-25: qty 1

## 2017-06-25 MED ORDER — DIAZEPAM 2 MG PO TABS: 2.0000 mg | ORAL_TABLET | Freq: Four times a day (QID) | ORAL | 0 refills | Status: AC | PRN

## 2017-06-25 NOTE — ED Notes (Signed)
Patient reports woke this morning with right shoulder pain and reports as day progressed range of motion decreased.  Denies any type of known injury.

## 2017-06-25 NOTE — ED Provider Notes (Signed)
[facility] Emergency Department Provider Note   ____________________________________________   First MD Initiated Contact with Patient 06/25/17 [phone]     (approximate)  I have reviewed the triage vital signs and the nursing notes.   HISTORY  Chief Complaint Shoulder Pain    HPI Maria Mullins is a 52 y.o. female who comes into the hospital today with some right-sided shoulder pain.  The patient states that she woke up Friday morning and thought she may have slept wrong.  She was having pain in her shoulder.  She states that as time has progressed the pain is gotten worse and she is been unable to move her arm.  She reports that she is been taking ibuprofen and Tylenol for pain.  The patient is also been using a heating pad but is not helping.  The patient denies any trauma or injury.  She has no numbness or tingling to her fingers.  The patient rates her pain a 10 out of 10 in intensity.  She denies any chest pain or shortness of breath.  She is here today for evaluation.  Past Medical History:  Diagnosis Date  . Hypertension     Patient Active Problem List   Diagnosis Date Noted  . Iron deficiency 07/29/2014  . 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 Essential hypertension 04/01/2014  . Hyperlipidemia 04/01/2014    Past Surgical History:  Procedure Laterality Date  . APPENDECTOMY    . c sections    . COLONOSCOPY WITH PROPOFOL N/A 06/19/2017   Procedure: COLONOSCOPY WITH PROPOFOL;  Surgeon: Jonathon Bellows, MD;  Location: [facility] ENDOSCOPY;  Service: Gastroenterology;  Laterality: N/A;  . TUBAL LIGATION      Prior to Admission medications   Medication Sig Start Date End Date Taking? Authorizing Provider  aspirin EC 81 MG tablet Take 81 mg by mouth daily.    [provider]  ATORVASTATIN CALCIUM PO Take 1 tablet by mouth daily.    [provider]  diazepam (VALIUM) 2 MG tablet Take 1 tablet (2 mg  total) by mouth every 6 (six) hours as needed for anxiety. 06/25/17   Loney Hering, MD  ferrous sulfate 325 (65 FE) MG tablet Take 325 mg by mouth daily with breakfast.    [provider]  lidocaine (LIDODERM) 5 % Place 1 patch onto the skin every 12 (twelve) hours. Remove & Discard patch within 12 hours or as directed by MD 06/25/17 06/25/18  Loney Hering, MD  losartan (COZAAR) 25 MG tablet Take 25 mg by mouth every morning.    [provider]  METOPROLOL TARTRATE PO Take 1 tablet by mouth daily.    [provider]  traMADol (ULTRAM) 50 MG tablet Take 1 tablet (50 mg total) by mouth every 6 (six) hours as needed. 06/25/17   Loney Hering, MD    Allergies Lisinopril  No family history on file.  Social History Social History   Tobacco Use  . Smoking status: Never Smoker  . Smokeless tobacco: Never Used  Substance Use Topics  . Alcohol use: No  . Drug use: No    Review of Systems  Constitutional: No fever/chills Eyes: No visual changes. ENT: No sore throat. Cardiovascular: Denies chest pain. Respiratory: Denies shortness of breath. Gastrointestinal: No abdominal pain.  Genitourinary: Negative for dysuria. Musculoskeletal: Right shoulder pain Skin: Negative for rash. Neurological: Negative for headaches,    ____________________________________________   PHYSICAL EXAM:  VITAL SIGNS: ED  Triage Vitals  Enc Vitals Group     BP 06/25/17 0041 (!) 141/99     Pulse Rate 06/25/17 0041 65     Resp 06/25/17 0041 18     Temp 06/25/17 0041 97.9 F (36.6 C)     Temp Source 06/25/17 0041 Oral     SpO2 06/25/17 0041 98 %     Weight 06/25/17 0041 213 lb (96.6 kg)     Height 06/25/17 0041 5' 4"  (1.626 m)     Head Circumference --      Peak Flow --      Pain Score 06/25/17 0012 7     Pain Loc --      Pain Edu? --      Excl. in [place]? --     Constitutional: Alert and oriented. Well appearing and in moderate distress. Eyes: Conjunctivae are  normal. PERRL. EOMI. Head: Atraumatic. Nose: No congestion/rhinnorhea. Mouth/Throat: Mucous membranes are moist.  Oropharynx non-erythematous. Cardiovascular: Normal rate, regular rhythm. Grossly normal heart sounds.  Good peripheral circulation. Respiratory: Normal respiratory effort.  No retractions. Lungs CTAB. Gastrointestinal: Soft and nontender. No distention.  Positive bowel sounds Musculoskeletal: Right shoulder pain to palpation over the deltoids and posterior.  The patient also has pains with any range of motion of the right shoulder.  The patient is able to flex her forearm but once she has full flexion at the biceps she is having some shoulder pain as well.  No redness or swelling noted Neurologic:  Normal speech and language. . Skin:  Skin is warm, dry and intact.  Psychiatric: Mood and affect are normal.   ____________________________________________   LABS (all labs ordered are listed, but only abnormal results are displayed)  Labs Reviewed - No data to display ____________________________________________  EKG  none ____________________________________________  RADIOLOGY  ED MD interpretation: Right shoulder x-ray: No fracture or dislocation of the right shoulder, mild acromioclavicular joint osteoarthritis.  Official radiology report(s): Dg Shoulder Right  Result Date: 06/25/2017 CLINICAL DATA:  Shoulder pain EXAM: RIGHT SHOULDER - 2+ VIEW COMPARISON:  None. FINDINGS: No fracture or dislocation.  Mild acromioclavicular osteoarthrosis. IMPRESSION: No fracture or dislocation of the right shoulder. Mild acromioclavicular joint osteoarthrosis. The Electronically Signed   By: Ulyses Jarred M.D.   On: 06/25/2017 04:38    ____________________________________________   PROCEDURES  Procedure(s) performed: None  Procedures  Critical Care performed: No  ____________________________________________   INITIAL IMPRESSION / ASSESSMENT AND PLAN / ED COURSE  As part  of my medical decision making, I reviewed the following data within the electronic MEDICAL RECORD NUMBER Notes from prior ED visits and [place] Controlled Substance Database   This is a 52 year old female who comes into the hospital today with some right shoulder pain.  My differential diagnosis includes arthritis, bursitis, rotator cuff injury, humerus fracture  I did send the patient for an x-ray of her shoulder.  There is no fracture dislocation but the patient does have some osteoarthrosis of her AC joint.  I did give the patient a shot of Toradol as well as some Valium and a Lidoderm patch to her shoulder.  The patient's pain is improved.  At this time there is still a concern for rotator cuff injury.  I will place the patient in a sling and have her follow-up with orthopedic surgery for further evaluation.  The patient has no other concerns at this time.      ____________________________________________   FINAL CLINICAL IMPRESSION(S) / ED DIAGNOSES  Final diagnoses:  Acute pain of right shoulder     ED Discharge Orders        Ordered    lidocaine (LIDODERM) 5 %  Every 12 hours     06/25/17 0528    traMADol (ULTRAM) 50 MG tablet  Every 6 hours PRN     06/25/17 0528    diazepam (VALIUM) 2 MG tablet  Every 6 hours PRN     06/25/17 0528       Note:  This document was prepared using Dragon voice recognition software and may include unintentional dictation errors.    Loney Hering, MD 06/25/17 [phone]

## 2017-06-25 NOTE — ED Notes (Signed)
Patient ambulating around lobby without difficulty noted.

## 2017-06-25 NOTE — Discharge Instructions (Addendum)
Please follow up with orthopedic surgery for further evaluation of your shoulder pain and well as further treatment.

## 2017-06-25 NOTE — ED Triage Notes (Signed)
Pain reports woke with right shoulder pain, thought maybe she had slept wrong.  Reports over the day range of motion in shoulder decreased.

## 2017-06-26 ENCOUNTER — Telehealth: Payer: Self-pay

## 2017-06-26 NOTE — Telephone Encounter (Signed)
Unable to leave voicemail for patient callback. Mailbox full. Home phone line busy.    - inform biopsies show chronic colitis - she does not carry a diagnosis of IBD. She was a screening colon but appeared inflammed on procedure.   Suggest to see me in the office in 2-3 weeks to discuss further and get a better history

## 2017-07-02 ENCOUNTER — Telehealth: Payer: Self-pay

## 2017-07-02 NOTE — Telephone Encounter (Signed)
Unable to LVM for patient callback for results due to mailbox being full.    - inform biopsies show chronic colitis - she does not carry a diagnosis of IBD. She was a screening colon but appeared inflammed on procedure.   Suggest to see me in the office in 2-3 weeks to discuss further and get a better history   Mailed a letter with the result information.

## 2017-07-11 ENCOUNTER — Other Ambulatory Visit: Payer: Self-pay

## 2017-07-11 ENCOUNTER — Encounter: Payer: Self-pay | Admitting: Gastroenterology

## 2017-07-11 ENCOUNTER — Ambulatory Visit: Payer: BLUE CROSS/BLUE SHIELD | Admitting: Gastroenterology

## 2017-07-11 VITALS — BP 138/89 | HR 79 | Ht 64.0 in | Wt 218.2 lb

## 2017-07-11 DIAGNOSIS — K51919 Ulcerative colitis, unspecified with unspecified complications: Secondary | ICD-10-CM

## 2017-07-11 DIAGNOSIS — R748 Abnormal levels of other serum enzymes: Secondary | ICD-10-CM | POA: Diagnosis not present

## 2017-07-11 MED ORDER — MESALAMINE ER 0.375 G PO CP24: 1500.0000 mg | ORAL_CAPSULE | Freq: Every day | ORAL | 3 refills | Status: DC

## 2017-07-11 MED ORDER — MESALAMINE ER 0.375 G PO CP24
1500.0000 mg | ORAL_CAPSULE | Freq: Every day | ORAL | 3 refills | Status: DC
Start: 2017-07-11 — End: 2017-07-11

## 2017-07-11 NOTE — Progress Notes (Addendum)
Jonathon Bellows MD, MRCP(U.K) [address]  [place], [place] [address]  Main: [phone]  Fax: [phone]   Gastroenterology Consultation  Referring Provider:     Elisabeth Cara, NP Primary Care Physician:  Elisabeth Cara, NP Primary Gastroenterologist:  Dr. Jonathon Bellows  Reason for Consultation:     Colonoscopy follow up to discuss results        HPI:   Maria Mullins is a 52 y.o. y/o female .She is here today to discuss the results of her recent colonoscopy she had on 06/19/17 for colon cancer screening . I noticed decreased vascular pattern in the transverse and descending colon. Biopsy results demonstrated chronic colitis with focal cryptitis extending from rectum to the transverse colon.    No diarrhea , none in the past , no rectal bleeding . No family history of IBD. She takes asprin 81 mg a day , no other NSAID's. One bowel movement in the morning , formed, no cramping.   Past Medical History:  Diagnosis Date  . Hypertension     Past Surgical History:  Procedure Laterality Date  . APPENDECTOMY    . c sections    . COLONOSCOPY WITH PROPOFOL N/A 06/19/2017   Procedure: COLONOSCOPY WITH PROPOFOL;  Surgeon: Jonathon Bellows, MD;  Location: [facility] ENDOSCOPY;  Service: Gastroenterology;  Laterality: N/A;  . TUBAL LIGATION      Prior to Admission medications   Medication Sig Start Date End Date Taking? Authorizing Provider  chlorthalidone (HYGROTON) 25 MG tablet chlorthalidone 25 mg tablet 07/29/14  Yes [provider]  amLODipine (NORVASC) 10 MG tablet Take by mouth.    [provider]  aspirin EC 81 MG tablet Take 81 mg by mouth daily.    [provider]  ATORVASTATIN CALCIUM PO Take 1 tablet by mouth daily.    [provider]  diazepam (VALIUM) 2 MG tablet Take 1 tablet (2 mg total) by mouth every 6 (six) hours as needed for anxiety. 06/25/17   Loney Hering, MD  doxycycline (VIBRA-TABS) 100 MG tablet  06/12/17    [provider]  ferrous sulfate 325 (65 FE) MG tablet Take 325 mg by mouth daily with breakfast.    [provider]  hydrALAZINE (APRESOLINE) 25 MG tablet  07/03/17   [provider]  lidocaine (LIDODERM) 5 % Place 1 patch onto the skin every 12 (twelve) hours. Remove & Discard patch within 12 hours or as directed by MD 06/25/17 06/25/18  Loney Hering, MD  losartan (COZAAR) 25 MG tablet Take 25 mg by mouth every morning.    [provider]  metoprolol succinate (TOPROL-XL) 25 MG 24 hr tablet  05/29/17   [provider]  METOPROLOL TARTRATE PO Take 1 tablet by mouth daily.    [provider]  metroNIDAZOLE (FLAGYL) 500 MG tablet metronidazole 500 mg tablet    [provider]  naproxen (NAPROSYN) 500 MG tablet naproxen 500 mg tablet    [provider]  oxyCODONE-acetaminophen (PERCOCET/ROXICET) 5-325 MG tablet oxycodone-acetaminophen 5 mg-325 mg tablet    [provider]  pravastatin (PRAVACHOL) 10 MG tablet Take by mouth.    [provider]  traMADol (ULTRAM) 50 MG tablet Take 1 tablet (50 mg total) by mouth every 6 (six) hours as needed. 06/25/17   Loney Hering, MD    No family history on file.   Social History   Tobacco Use  . Smoking status: Never Smoker  . Smokeless tobacco:  Never Used  Substance Use Topics  . Alcohol use: No  . Drug use: No    Allergies as of 07/11/2017 - Review Complete 06/25/2017  Allergen Reaction Noted  . Lisinopril Swelling 01/28/2014    Review of Systems:    All systems reviewed and negative except where noted in HPI.   Physical Exam:  There were no vitals taken for this visit. No LMP recorded. Psych:  Alert and cooperative. Normal mood and affect. General:   Alert,  Well-developed, well-nourished, pleasant and cooperative in NAD Head:  Normocephalic and atraumatic. Eyes:  Sclera clear, no icterus.   Conjunctiva pink. Ears:  Normal auditory  acuity. Nose:  No deformity, discharge, or lesions. Mouth:  No deformity or lesions,oropharynx pink & moist. Neck:  Supple; no masses or thyromegaly. Lungs:  Respirations even and unlabored.  Clear throughout to auscultation.   No wheezes, crackles, or rhonchi. No acute distress. Heart:  Regular rate and rhythm; no murmurs, clicks, rubs, or gallops. Abdomen:  Normal bowel sounds.  No bruits.  Soft, non-tender and non-distended without masses, hepatosplenomegaly or hernias noted.  No guarding or rebound tenderness.    Msk:  Symmetrical without gross deformities. Good, equal movement & strength bilaterally. Pulses:  Normal pulses noted. Extremities:  No clubbing or edema.  No cyanosis. Neurologic:  Alert and oriented x3;  grossly normal neurologically. Skin:  Intact without significant lesions or rashes. No jaundice. Lymph Nodes:  No significant cervical adenopathy. Psych:  Alert and cooperative. Normal mood and affect.  Imaging Studies: Dg Shoulder Right  Result Date: 06/25/2017 CLINICAL DATA:  Shoulder pain EXAM: RIGHT SHOULDER - 2+ VIEW COMPARISON:  None. FINDINGS: No fracture or dislocation.  Mild acromioclavicular osteoarthrosis. IMPRESSION: No fracture or dislocation of the right shoulder. Mild acromioclavicular joint osteoarthrosis. The Electronically Signed   By: Ulyses Jarred M.D.   On: 06/25/2017 04:38    Assessment and Plan:   Maria Mullins is a 52 y.o. y/o female here to follow up to discuss recent colonoscopy results. She is asymptomatic but her colon and biopsies show features suggestive of ulcerative colitis. Explained she has options of not doing anything vs repeat colonoscopy in 6 months to a year ti check status or treat with mesalamine and check for resolution of inflammation on sigmoidoscopy in about 6 months. Explained chronic inflammation can increase risk of colon cancer. She is not on NSAID's and will discuss health maintenance at next visit. Aprisio samples  provided with coupon. Patient information provided on ulcerative colitis.   I also note her alkaline phosphatase has been chronically elevated. I will check autoimmune labs, viral hepatitis labs and rule out PSC/PBC as its association with IBD  Follow up in 3 months.   Dr Jonathon Bellows MD,MRCP(U.K)

## 2017-07-11 NOTE — Patient Instructions (Signed)
1. Given samples of Apriso.  2. 4 tabs in the morning daily. 3. Call Central Scheduling at [phone] to schedule ultrasound. Provide name and date of birth to associate.

## 2017-07-11 NOTE — Addendum Note (Signed)
Addended by: Jonathon Bellows on: 07/11/2017 03:07 PM   Modules accepted: Orders

## 2017-07-16 ENCOUNTER — Telehealth: Payer: Self-pay | Admitting: Gastroenterology

## 2017-07-16 NOTE — Telephone Encounter (Signed)
Pt is calling for Panya in regards to rx she was perscripted pt states the pharmacy does not have it and see if she could send it to different pharmacy or receive more samples please call pt

## 2017-07-18 NOTE — Telephone Encounter (Signed)
LVM for patient to callback. Returning call from patient:   Pt is calling for Maria Mullins in regards to rx she was perscripted pt states the pharmacy does not have it and see if she could send it to different pharmacy or receive more samples please call pt

## 2017-10-11 ENCOUNTER — Encounter: Payer: Self-pay | Admitting: Gastroenterology

## 2017-10-11 ENCOUNTER — Other Ambulatory Visit: Payer: Self-pay

## 2017-10-11 ENCOUNTER — Ambulatory Visit: Payer: BLUE CROSS/BLUE SHIELD | Admitting: Gastroenterology

## 2017-10-11 NOTE — Progress Notes (Deleted)
Summary of history :  She was initially seen on 07/11/17  to discuss the results of her recent colonoscopy she had on 06/19/17 for colon cancer screening . I noticed decreased vascular pattern in the transverse and descending colon. Biopsy results demonstrated chronic colitis with focal cryptitis extending from rectum to the transverse colon. No diarrhea , none in the past , no rectal bleeding . No family history of IBD. She takes asprin 81 mg a day , no other NSAID's. One bowel movement in the morning , formed, no cramping.    Interval history   07/11/2017-  10/11/2017  At her last visit in 07/2017 I  noted her alkaline phosphatase had been chronically elevated. I wanted to check autoimmune labs, viral hepatitis labs and rule out PSC/PBC as its association with IBD. She did not obtain the labs.    a A Bowermaster is a 52 y.o. y/o female here to follow up to discuss recent colonoscopy results. She is asymptomatic but her colon and biopsies show features suggestive of ulcerative colitis. Explained she has options of not doing anything vs repeat colonoscopy in 6 months to a year to check status or treat with mesalamine and check for resolution of inflammation on sigmoidoscopy in about 6 months. Explained chronic inflammation can increase risk of colon cancer. She is not on NSAID's and will discuss health maintenance at next visit. Aprisio samples provided with coupon. Patient information provided on ulcerative colitis.   I also note her alkaline phosphatase has been chronically elevated. I attempt to recheck autoimmune labs, viral hepatitis labs and rule out PSC/PBC as its association with IBD

## 2017-10-16 ENCOUNTER — Encounter (INDEPENDENT_AMBULATORY_CARE_PROVIDER_SITE_OTHER): Payer: Self-pay

## 2017-10-16 ENCOUNTER — Ambulatory Visit
Admission: RE | Admit: 2017-10-16 | Discharge: 2017-10-16 | Disposition: A | Discharge status: Home / Self Care | Payer: BLUE CROSS/BLUE SHIELD | Source: Ambulatory Visit | Attending: Nurse Practitioner | Admitting: Nurse Practitioner

## 2017-10-16 DIAGNOSIS — Z1239 Encounter for other screening for malignant neoplasm of breast: Secondary | ICD-10-CM | POA: Insufficient documentation

## 2018-03-08 LAB — CBC WITH DIFFERENTIAL/PLATELET
BASOS: 0 %
Basophils Absolute: 0 10*3/uL (ref 0.0–0.2)
EOS (ABSOLUTE): 0.3 10*3/uL (ref 0.0–0.4)
Eos: 4 %
Hematocrit: 39.3 % (ref 34.0–46.6)
Hemoglobin: 12.6 g/dL (ref 11.1–15.9)
IMMATURE GRANS (ABS): 0 10*3/uL (ref 0.0–0.1)
IMMATURE GRANULOCYTES: 0 %
LYMPHS: 36 %
Lymphocytes Absolute: 3.2 10*3/uL — ABNORMAL HIGH (ref 0.7–3.1)
MCH: 27.5 pg (ref 26.6–33.0)
MCHC: 32.1 g/dL (ref 31.5–35.7)
MCV: 86 fL (ref 79–97)
MONOS ABS: 0.8 10*3/uL (ref 0.1–0.9)
Monocytes: 9 %
NEUTROS ABS: 4.6 10*3/uL (ref 1.4–7.0)
NEUTROS PCT: 51 %
Platelets: 359 10*3/uL (ref 150–450)
RBC: 4.59 x10E6/uL (ref 3.77–5.28)
RDW: 14.9 % (ref 12.3–15.4)
WBC: 9 10*3/uL (ref 3.4–10.8)

## 2018-03-08 LAB — ANA W/REFLEX: Anti Nuclear Antibody(ANA): NEGATIVE

## 2018-03-08 LAB — ALKALINE PHOSPHATASE, ISOENZYMES
Alkaline Phosphatase: 160 IU/L — ABNORMAL HIGH (ref 39–117)
BONE FRACTION: 17 % (ref 14–68)
INTESTINAL FRAC.: 64 % — ABNORMAL HIGH (ref 0–18)
LIVER FRACTION: 19 % (ref 18–85)

## 2018-03-08 LAB — GAMMA GT: GGT: 31 IU/L (ref 0–60)

## 2018-03-08 LAB — MITOCHONDRIAL/SMOOTH MUSCLE AB PNL
Mitochondrial Ab: <20 Units (ref 0.0–20.0)
SMOOTH MUSCLE AB: 24 U — AB (ref 0–19)

## 2018-03-08 LAB — HEPATITIS A ANTIBODY, TOTAL: Hep A Total Ab: POSITIVE — AB

## 2018-03-08 LAB — CELIAC DISEASE PANEL
ENDOMYSIAL IGA: NEGATIVE
IgA/Immunoglobulin A, Serum: 233 mg/dL (ref 87–352)

## 2018-03-08 LAB — IRON,TIBC AND FERRITIN PANEL
Ferritin: 20 ng/mL (ref 15–150)
Iron Saturation: 14 % — ABNORMAL LOW (ref 15–55)
Iron: 46 ug/dL (ref 27–159)
TIBC: 325 ug/dL (ref 250–450)
UIBC: 279 ug/dL (ref 131–425)

## 2018-03-08 LAB — HEPATITIS B SURFACE ANTIBODY,QUALITATIVE: Hep B Surface Ab, Qual: NONREACTIVE

## 2018-03-08 LAB — HEPATITIS B SURFACE ANTIGEN: Hepatitis B Surface Ag: NEGATIVE

## 2018-03-08 LAB — C-REACTIVE PROTEIN: CRP: 1 mg/L (ref 0–10)

## 2018-03-08 LAB — HEPATITIS C ANTIBODY

## 2018-03-08 LAB — CERULOPLASMIN: Ceruloplasmin: 27.7 mg/dL (ref 19.0–39.0)

## 2018-03-22 ENCOUNTER — Telehealth: Payer: Self-pay

## 2018-03-22 NOTE — Telephone Encounter (Signed)
Spoke with pt and informed her of lab results and Dr. Georgeann Oppenheim instructions for pt to receive Hepatitis B vaccine. I explained that she could visit her PCP or local health dept for this vaccination.

## 2018-03-22 NOTE — Telephone Encounter (Signed)
-----   Message from Jonathon Bellows, MD sent at 03/17/2018  4:10 PM EST ----- Sherald Hess inform that - liver tests show that the reason alkaline phosphotase is elevated is probably related to her colitis. No inflammation in the liver. Immune to hep A, needs vaccine for hep B  Will discuss further at office visit.   Dr Jonathon Bellows MD,MRCP [facility]) Gastroenterology/Hepatology Pager: [phone]

## 2018-04-08 ENCOUNTER — Encounter: Payer: Self-pay | Admitting: Gastroenterology

## 2018-04-08 ENCOUNTER — Other Ambulatory Visit: Payer: Self-pay

## 2018-04-08 ENCOUNTER — Ambulatory Visit: Payer: BLUE CROSS/BLUE SHIELD | Admitting: Gastroenterology

## 2018-04-08 VITALS — BP 130/79 | HR 65 | Ht 64.0 in | Wt 231.8 lb

## 2018-04-08 DIAGNOSIS — K51919 Ulcerative colitis, unspecified with unspecified complications: Secondary | ICD-10-CM

## 2018-04-08 NOTE — Progress Notes (Signed)
Jonathon Bellows MD, MRCP(U.K) [address]  [place]  [place], [place] [address]  Main: [phone]  Fax: [phone]   Primary Care Physician: Maria Cara, NP  Primary Gastroenterologist:  Dr. Jonathon Bellows   No chief complaint on file.   HPI: Maria Mullins is a 53 y.o. female   Summary of history :  She is here today to follow up to her last visit back in 07/2017 . She had on 06/19/17 a colonoscopy  for colon cancer screening . I noticed decreased vascular pattern in the transverse and descending colon. Biopsy results demonstrated chronic colitis with focal cryptitis extending from rectum to the transverse colon. No diarrhea , none in the past , no rectal bleeding . No family history of IBD. She takes asprin 81 mg a day , no other NSAID's. One bowel movement in the morning , formed, no cramping.      Interval history   07/11/2017-  04/08/2018  She is due to undergo  Parathyroidectomy next week   Immune to hep A , required vaccine for hep B. GGT normal. Viral hepatitis negative, Normal transaminases- elevated smooth muscle antibody.  Taking Aprisio 4 tablets a day .Normal formed non bloody bowel movements   Current Outpatient Medications  Medication Sig Dispense Refill  . amLODipine (NORVASC) 10 MG tablet Take by mouth.    Marland Kitchen aspirin EC 81 MG tablet Take 81 mg by mouth daily.    Marland Kitchen atorvastatin (LIPITOR) 80 MG tablet   0  . ATORVASTATIN CALCIUM PO Take 1 tablet by mouth daily.    . chlorthalidone (HYGROTON) 25 MG tablet chlorthalidone 25 mg tablet    . diazepam (VALIUM) 2 MG tablet Take 1 tablet (2 mg total) by mouth every 6 (six) hours as needed for anxiety. (Patient not taking: Reported on 07/11/2017) 12 tablet 0  . ferrous sulfate 325 (65 FE) MG tablet Take 325 mg by mouth daily with breakfast.    . hydrALAZINE (APRESOLINE) 25 MG tablet   1  . hydrALAZINE (APRESOLINE) 50 MG tablet   1  . losartan (COZAAR) 25 MG tablet Take 25 mg by mouth every morning.    .  mesalamine (APRISO) 0.375 g 24 hr capsule Take 4 capsules (1.5 g total) by mouth daily. 240 capsule 3  . metoprolol succinate (TOPROL-XL) 25 MG 24 hr tablet   0   No current facility-administered medications for this visit.     Allergies as of 04/08/2018 - Review Complete 07/11/2017  Allergen Reaction Noted  . Lisinopril Swelling 01/28/2014    ROS:  General: Negative for anorexia, weight loss, fever, chills, fatigue, weakness. ENT: Negative for hoarseness, difficulty swallowing , nasal congestion. CV: Negative for chest pain, angina, palpitations, dyspnea on exertion, peripheral edema.  Respiratory: Negative for dyspnea at rest, dyspnea on exertion, cough, sputum, wheezing.  GI: See history of present illness. GU:  Negative for dysuria, hematuria, urinary incontinence, urinary frequency, nocturnal urination.  Endo: Negative for unusual weight change.    Physical Examination:   BP 130/79   Pulse 65   Ht 5' 4"  (1.626 m)   Wt 231 lb 12.8 oz (105.1 kg)   BMI 39.79 kg/m   General: Well-nourished, well-developed in no acute distress.  Eyes: No icterus. Conjunctivae pink. Mouth: Oropharyngeal mucosa moist and pink , no lesions erythema or exudate. Lungs: Clear to auscultation bilaterally. Non-labored. Heart: Regular rate and rhythm, no murmurs rubs or gallops.  Abdomen: Bowel sounds are normal, nontender, nondistended, no hepatosplenomegaly or masses,  no abdominal bruits or hernia , no rebound or guarding.   Extremities: No lower extremity edema. No clubbing or deformities. Neuro: Alert and oriented x 3.  Grossly intact. Skin: Warm and dry, no jaundice.   Psych: Alert and cooperative, normal mood and affect.   Imaging Studies: No results found.  Assessment and Plan:   Maria Mullins is a 53 y.o. y/o female here to follow up for possible ulcerative colitis diganosed incidentally during her routine screening colonoscopy. She is asymptomatic but her colon and biopsies show  features suggestive of ulcerative colitis..Been on Aprisio since 07/2017 .   Plan 1. Recheck LFT's, smooth muscle antibody 2. Vaccine for hep B 3. Varicella IGG 4. Colonoscopy in 2 months to check for inflammation resolution with ASA  I have discussed alternative options, risks & benefits,  which include, but are not limited to, bleeding, infection, perforation,respiratory complication & drug reaction.  The patient agrees with this plan & written consent will be obtained.     Dr Jonathon Bellows  MD,MRCP [facility]) Follow up in 3 months

## 2018-04-09 LAB — COMPREHENSIVE METABOLIC PANEL
ALK PHOS: 168 IU/L — AB (ref 39–117)
ALT: 31 IU/L (ref 0–32)
AST: 24 IU/L (ref 0–40)
Albumin/Globulin Ratio: 1.6 (ref 1.2–2.2)
Albumin: 4.2 g/dL (ref 3.5–5.5)
BUN/Creatinine Ratio: 9 (ref 9–23)
BUN: 9 mg/dL (ref 6–24)
Bilirubin Total: 0.4 mg/dL (ref 0.0–1.2)
CO2: 26 mmol/L (ref 20–29)
CREATININE: 0.95 mg/dL (ref 0.57–1.00)
Calcium: 12.2 mg/dL — ABNORMAL HIGH (ref 8.7–10.2)
Chloride: 106 mmol/L (ref 96–106)
GFR calc Af Amer: 80 mL/min/{1.73_m2} (ref >59)
GFR calc non Af Amer: 70 mL/min/{1.73_m2} (ref >59)
GLUCOSE: 101 mg/dL — AB (ref 65–99)
Globulin, Total: 2.6 g/dL (ref 1.5–4.5)
Potassium: 4.6 mmol/L (ref 3.5–5.2)
Sodium: 140 mmol/L (ref 134–144)
Total Protein: 6.8 g/dL (ref 6.0–8.5)

## 2018-04-09 LAB — MITOCHONDRIAL/SMOOTH MUSCLE AB PNL: SMOOTH MUSCLE AB: 18 U (ref 0–19)

## 2018-04-09 LAB — VARICELLA ZOSTER ANTIBODY, IGG: VARICELLA: 483 {index} (ref >165)

## 2018-04-09 LAB — C-REACTIVE PROTEIN: CRP: 1 mg/L (ref 0–10)

## 2018-04-22 ENCOUNTER — Ambulatory Visit
Admission: EM | Admit: 2018-04-22 | Discharge: 2018-04-22 | Disposition: A | Discharge status: Home / Self Care | Payer: BLUE CROSS/BLUE SHIELD | Attending: Family Medicine | Admitting: Family Medicine

## 2018-04-22 ENCOUNTER — Other Ambulatory Visit: Payer: Self-pay

## 2018-04-22 DIAGNOSIS — S29019A Strain of muscle and tendon of unspecified wall of thorax, initial encounter: Secondary | ICD-10-CM | POA: Insufficient documentation

## 2018-04-22 MED ORDER — CYCLOBENZAPRINE HCL 10 MG PO TABS: 10.0000 mg | ORAL_TABLET | Freq: Three times a day (TID) | ORAL | 0 refills | Status: AC | PRN

## 2018-04-22 MED ORDER — MELOXICAM 15 MG PO TABS: 15.0000 mg | ORAL_TABLET | Freq: Every day | ORAL | 0 refills | Status: AC

## 2018-04-22 MED ORDER — HYDROCODONE-ACETAMINOPHEN 5-325 MG PO TABS: ORAL_TABLET | ORAL | 0 refills | Status: AC

## 2018-04-22 NOTE — ED Provider Notes (Signed)
[facility]    CSN: [id number] Arrival date & time: 04/22/18  1658     History   Chief Complaint Chief Complaint  Patient presents with  . Back Pain  . Shoulder Pain    HPI Maria Mullins is a 53 y.o. female.   53 yo female with a c/o right upper back pain radiating to the neck and shoulder for the past 2 days. States she woke up 2 days ago with the symptoms. Denies any falls or other injuries.      Past Medical History:  Diagnosis Date  . Hypertension     Patient Active Problem List   Diagnosis Date Noted  . Iron deficiency 07/29/2014  . 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 Essential hypertension 04/01/2014  . Hyperlipidemia 04/01/2014    Past Surgical History:  Procedure Laterality Date  . APPENDECTOMY    . c sections    . COLONOSCOPY WITH PROPOFOL N/A 06/19/2017   Procedure: COLONOSCOPY WITH PROPOFOL;  Surgeon: Jonathon Bellows, MD;  Location: [facility] ENDOSCOPY;  Service: Gastroenterology;  Laterality: N/A;  . TUBAL LIGATION      OB History   No obstetric history on file.      Home Medications    Prior to Admission medications   Medication Sig Start Date End Date Taking? Authorizing Provider  amLODipine (NORVASC) 10 MG tablet Take by mouth.    [provider]  aspirin EC 81 MG tablet Take 81 mg by mouth daily.    [provider]  atorvastatin (LIPITOR) 80 MG tablet  10/02/17   [provider]  ATORVASTATIN CALCIUM PO Take 1 tablet by mouth daily.    [provider]  chlorthalidone (HYGROTON) 25 MG tablet chlorthalidone 25 mg tablet 07/29/14   [provider]  cyclobenzaprine (FLEXERIL) 10 MG tablet Take 1 tablet (10 mg total) by mouth 3 (three) times daily as needed for muscle spasms. 04/22/18   Norval Gable, MD  diazepam (VALIUM) 2 MG tablet Take 1 tablet (2 mg total) by mouth every 6 (six) hours as needed for anxiety. Patient not taking: Reported on 07/11/2017  06/25/17   Loney Hering, MD  ferrous sulfate 325 (65 FE) MG tablet Take 325 mg by mouth daily with breakfast.    [provider]  hydrALAZINE (APRESOLINE) 25 MG tablet  07/03/17   [provider]  hydrALAZINE (APRESOLINE) 50 MG tablet  09/04/17   [provider]  HYDROcodone-acetaminophen (NORCO/VICODIN) 5-325 MG tablet 1-2 tabs po bid prn 04/22/18   Norval Gable, MD  losartan (COZAAR) 25 MG tablet Take 25 mg by mouth every morning.    [provider]  meloxicam (MOBIC) 15 MG tablet Take 1 tablet (15 mg total) by mouth daily. 04/22/18   Norval Gable, MD  mesalamine (APRISO) 0.375 g 24 hr capsule Take 4 capsules (1.5 g total) by mouth daily. 07/11/17   Jonathon Bellows, MD  metoprolol succinate (TOPROL-XL) 25 MG 24 hr tablet  05/29/17   [provider]    Family History Family History  Problem Relation Age of Onset  . Breast cancer Neg Hx     Social History Social History   Tobacco Use  . Smoking status: Never Smoker  . Smokeless tobacco: Never Used  Substance Use Topics  . Alcohol use: No  . Drug use: No     Allergies   Lisinopril   Review of Systems Review of Systems   Physical Exam Triage Vital Signs ED  Triage Vitals  Enc Vitals Group     BP 04/22/18 1716 (!) 153/93     Pulse Rate 04/22/18 1716 (!) 58     Resp 04/22/18 1716 16     Temp 04/22/18 1716 (!) 97.5 F (36.4 C)     Temp Source 04/22/18 1716 Oral     SpO2 04/22/18 1716 97 %     Weight 04/22/18 1717 231 lb 11.3 oz (105.1 kg)     Height 04/22/18 1717 5' 4"  (1.626 m)     Head Circumference --      Peak Flow --      Pain Score 04/22/18 1717 10     Pain Loc --      Pain Edu? --      Excl. in [place]? --    No data found.  Updated Vital Signs BP (!) 153/93 (BP Location: Right Arm)   Pulse (!) 58   Temp (!) 97.5 F (36.4 C) (Oral)   Resp 16   Ht 5' 4"  (1.626 m)   Wt 105.1 kg   LMP 12/21/2017   SpO2 97%   BMI 39.77 kg/m   Visual Acuity Right Eye Distance:     Left Eye Distance:   Bilateral Distance:    Right Eye Near:   Left Eye Near:    Bilateral Near:     Physical Exam Vitals signs and nursing note reviewed.  Constitutional:      General: She is not in acute distress.    Appearance: She is well-developed. She is not diaphoretic.  HENT:     Head: Normocephalic and atraumatic.  Eyes:     Pupils: Pupils are equal, round, and reactive to light.  Neck:     Musculoskeletal: Normal range of motion and neck supple.     Thyroid: No thyromegaly.     Trachea: No tracheal deviation.  Musculoskeletal:     Cervical back: She exhibits tenderness (over the right trapezius muscle) and spasm. She exhibits normal range of motion, no bony tenderness, no swelling, no edema, no deformity, no laceration and normal pulse.  Lymphadenopathy:     Cervical: No cervical adenopathy.  Neurological:     Mental Status: She is alert and oriented to person, place, and time.     Cranial Nerves: No cranial nerve deficit.     Motor: No abnormal muscle tone.     Coordination: Coordination normal.     Deep Tendon Reflexes: Reflexes are normal and symmetric.      UC Treatments / Results  Labs (all labs ordered are listed, but only abnormal results are displayed) Labs Reviewed - No data to display  EKG None  Radiology No results found.  Procedures Procedures (including critical care time)  Medications Ordered in UC Medications - No data to display  Initial Impression / Assessment and Plan / UC Course  I have reviewed the triage vital signs and the nursing notes.  Pertinent labs & imaging results that were available during my care of the patient were reviewed by me and considered in my medical decision making (see chart for details).      Final Clinical Impressions(s) / UC Diagnoses   Final diagnoses:  Thoracic myofascial strain, initial encounter    ED Prescriptions    Medication Sig Dispense Auth. Provider   cyclobenzaprine (FLEXERIL) 10 MG  tablet Take 1 tablet (10 mg total) by mouth 3 (three) times daily as needed for muscle spasms. 30 tablet Norval Gable, MD   meloxicam [facility])  15 MG tablet Take 1 tablet (15 mg total) by mouth daily. 30 tablet Norval Gable, MD   HYDROcodone-acetaminophen (NORCO/VICODIN) 5-325 MG tablet 1-2 tabs po bid prn 6 tablet Norval Gable, MD      1. diagnosis reviewed with patient 2. rx as per orders above; reviewed possible side effects, interactions, risks and benefits  3. Recommend supportive treatment with heat to area 4. Follow-up prn if symptoms worsen or don't improve   Controlled Substance Prescriptions [place] Controlled Substance Registry consulted? Not Applicable   Norval Gable, MD 04/22/18 [phone]

## 2018-04-22 NOTE — ED Triage Notes (Signed)
Right upper back pain starting yesterday. Now into right neck and down back of arm. Started upon wakening. Sometimes feels stiff. Pain 10/10

## 2018-04-23 ENCOUNTER — Encounter: Payer: Self-pay | Admitting: Gastroenterology

## 2018-06-06 ENCOUNTER — Other Ambulatory Visit: Payer: Self-pay

## 2018-06-06 ENCOUNTER — Telehealth: Payer: Self-pay | Admitting: Gastroenterology

## 2018-06-06 ENCOUNTER — Encounter: Payer: Self-pay | Admitting: Anesthesiology

## 2018-06-06 MED ORDER — NA SULFATE-K SULFATE-MG SULF 17.5-3.13-1.6 GM/177ML PO SOLN: 1.0000 | Freq: Once | ORAL | 0 refills | Status: AC

## 2018-06-06 NOTE — Telephone Encounter (Signed)
Please call in colonoscopy  prep for patient to [facility] on [address] for her procedure 06-07-2018. She has lost the prescription. She has checked with them & the do not show record of it.

## 2018-06-06 NOTE — Telephone Encounter (Signed)
Pt left vm she has procedure tomorrow and can not find her rx to clean out she uses [address]

## 2018-06-06 NOTE — Telephone Encounter (Signed)
Called pt to inform her that we have sent the prescription for the bowel prep kit to pt preferred pharmacy.  Unable to contact, left detailed VM

## 2018-06-07 ENCOUNTER — Ambulatory Visit
Admission: RE | Admit: 2018-06-07 | Discharge: 2018-06-07 | Disposition: A | Discharge status: Home / Self Care | Payer: BLUE CROSS/BLUE SHIELD | Attending: Gastroenterology | Admitting: Gastroenterology

## 2018-06-07 ENCOUNTER — Ambulatory Visit: Payer: BLUE CROSS/BLUE SHIELD | Admitting: Anesthesiology

## 2018-06-07 ENCOUNTER — Encounter
Admission: RE | Disposition: A | Discharge status: Home / Self Care | Payer: Self-pay | Source: Home / Self Care | Attending: Gastroenterology

## 2018-06-07 DIAGNOSIS — Z8673 Personal history of transient ischemic attack (TIA), and cerebral infarction without residual deficits: Secondary | ICD-10-CM | POA: Diagnosis not present

## 2018-06-07 DIAGNOSIS — Z79899 Other long term (current) drug therapy: Secondary | ICD-10-CM | POA: Insufficient documentation

## 2018-06-07 DIAGNOSIS — Z791 Long term (current) use of non-steroidal anti-inflammatories (NSAID): Secondary | ICD-10-CM | POA: Insufficient documentation

## 2018-06-07 DIAGNOSIS — K6289 Other specified diseases of anus and rectum: Secondary | ICD-10-CM | POA: Diagnosis not present

## 2018-06-07 DIAGNOSIS — Z7982 Long term (current) use of aspirin: Secondary | ICD-10-CM | POA: Insufficient documentation

## 2018-06-07 DIAGNOSIS — K51919 Ulcerative colitis, unspecified with unspecified complications: Secondary | ICD-10-CM

## 2018-06-07 DIAGNOSIS — I10 Essential (primary) hypertension: Secondary | ICD-10-CM | POA: Insufficient documentation

## 2018-06-07 DIAGNOSIS — K529 Noninfective gastroenteritis and colitis, unspecified: Secondary | ICD-10-CM | POA: Insufficient documentation

## 2018-06-07 HISTORY — PX: COLONOSCOPY WITH PROPOFOL: SHX5780

## 2018-06-07 SURGERY — COLONOSCOPY WITH PROPOFOL
Anesthesia: General

## 2018-06-07 MED ORDER — PHENYLEPHRINE HCL 10 MG/ML IJ SOLN
INTRAMUSCULAR | Status: AC
Start: 2018-06-07 — End: ?
  Filled 2018-06-07: qty 1

## 2018-06-07 MED ORDER — PROPOFOL 500 MG/50ML IV EMUL
INTRAVENOUS | Status: AC
Start: 2018-06-07 — End: ?
  Filled 2018-06-07: qty 50

## 2018-06-07 MED ORDER — LIDOCAINE HCL (PF) 2 % IJ SOLN
INTRAMUSCULAR | Status: AC
  Filled 2018-06-07: qty 10

## 2018-06-07 MED ORDER — GLYCOPYRROLATE 0.2 MG/ML IJ SOLN
INTRAMUSCULAR | Status: AC
  Filled 2018-06-07: qty 1

## 2018-06-07 MED ORDER — PROPOFOL 500 MG/50ML IV EMUL
INTRAVENOUS | Status: DC | PRN
  Administered 2018-06-07: 150 ug/kg/min via INTRAVENOUS

## 2018-06-07 MED ORDER — MIDAZOLAM HCL 2 MG/2ML IJ SOLN
INTRAMUSCULAR | Status: AC
  Filled 2018-06-07: qty 2

## 2018-06-07 MED ORDER — PROPOFOL 10 MG/ML IV BOLUS
INTRAVENOUS | Status: DC | PRN
  Administered 2018-06-07: 80 mg via INTRAVENOUS

## 2018-06-07 MED ORDER — LIDOCAINE HCL (CARDIAC) PF 100 MG/5ML IV SOSY
PREFILLED_SYRINGE | INTRAVENOUS | Status: DC | PRN
  Administered 2018-06-07: 40 mg via INTRAVENOUS

## 2018-06-07 MED ORDER — PROPOFOL 500 MG/50ML IV EMUL
INTRAVENOUS | Status: AC
  Filled 2018-06-07: qty 50

## 2018-06-07 MED ORDER — MIDAZOLAM HCL 2 MG/2ML IJ SOLN
INTRAMUSCULAR | Status: DC | PRN
  Administered 2018-06-07: 2 mg via INTRAVENOUS

## 2018-06-07 MED ORDER — SUCCINYLCHOLINE CHLORIDE 20 MG/ML IJ SOLN
INTRAMUSCULAR | Status: AC
  Filled 2018-06-07: qty 1

## 2018-06-07 MED ORDER — SODIUM CHLORIDE 0.9 % IV SOLN
INTRAVENOUS | Status: DC
  Administered 2018-06-07: 1000 mL via INTRAVENOUS

## 2018-06-07 NOTE — Op Note (Signed)
[facility] Patient Name: Maria Mullins Procedure Date: 06/07/2018 10:31 AM MRN: [id number] Account #: [id number] Date of Birth: [birth year] Admit Type: Outpatient Age: 53 Room: [facility] ENDO ROOM 1 Gender: Female Note Status: Finalized Procedure:            Colonoscopy Indications:          Follow-up of colitis Providers:            Jonathon Bellows MD, MD Referring MD:         Elisabeth Cara, NP Medicines:            Monitored Anesthesia Care Complications:        No immediate complications. Procedure:            Pre-Anesthesia Assessment:                       - Prior to the procedure, a History and Physical was                        performed, and patient medications, allergies and                        sensitivities were reviewed. The patient's tolerance of                        previous anesthesia was reviewed.                       - The risks and benefits of the procedure and the                        sedation options and risks were discussed with the                        patient. All questions were answered and informed                        consent was obtained.                       - ASA Grade Assessment: II - A patient with mild                        systemic disease.                       After obtaining informed consent, the colonoscope was                        passed under direct vision. Throughout the procedure,                        the patient's blood pressure, pulse, and oxygen                        saturations were monitored continuously. The                        Colonoscope was introduced through the anus and                        advanced to the  the cecum, identified by the                        appendiceal orifice, IC valve and transillumination.                        The colonoscopy was performed with ease. The patient                        tolerated the procedure well. The quality of the bowel        preparation was fair. Findings:      The perianal and digital rectal examinations were normal.      The mucosa vascular pattern in the sigmoid colon, in the descending       colon and in the transverse colon was diffusely decreased. This was       biopsied with a cold forceps for histology.      The exam was otherwise without abnormality on direct and retroflexion       views. Impression:           - Preparation of the colon was fair.                       - Decreased mucosa vascular pattern in the sigmoid                        colon, in the descending colon and in the transverse                        colon. Biopsied.                       - The examination was otherwise normal on direct and                        retroflexion views. Recommendation:       - Discharge patient to home (with escort).                       - Resume previous diet.                       - Continue present medications.                       - Await pathology results.                       - Return to my office as previously scheduled. Procedure Code(s):    --- Professional ---                       [id number], Colonoscopy, flexible; with biopsy, single or                        multiple Diagnosis Code(s):    --- Professional ---                       K52.9, Noninfective gastroenteritis and colitis,                        unspecified CPT copyright 2018 American Medical Association. All rights  reserved. The codes documented in this report are preliminary and upon coder review may  be revised to meet current compliance requirements. Jonathon Bellows, MD Jonathon Bellows MD, MD 06/07/2018 10:52:31 AM This report has been signed electronically. Number of Addenda: 0 Note Initiated On: 06/07/2018 10:31 AM Scope Withdrawal Time: 0 hours 8 minutes 3 seconds  Total Procedure Duration: 0 hours 12 minutes 23 seconds       [facility]

## 2018-06-07 NOTE — Anesthesia Preprocedure Evaluation (Signed)
Anesthesia Evaluation  Patient identified by MRN, date of birth, ID band Patient awake    Reviewed: Allergy & Precautions, NPO status , Patient's Chart, lab work & pertinent test results, reviewed documented beta blocker date and time   Airway Mallampati: III  TM Distance: >3 FB     Dental  (+) Chipped   Pulmonary           Cardiovascular hypertension, Pt. on medications and Pt. on home beta blockers      Neuro/Psych CVA    GI/Hepatic   Endo/Other    Renal/GU      Musculoskeletal   Abdominal   Peds  Hematology   Anesthesia Other Findings   Reproductive/Obstetrics                             Anesthesia Physical Anesthesia Plan  ASA: III  Anesthesia Plan: General   Post-op Pain Management:    Induction: Intravenous  PONV Risk Score and Plan:   Airway Management Planned:   Additional Equipment:   Intra-op Plan:   Post-operative Plan:   Informed Consent: I have reviewed the patients History and Physical, chart, labs and discussed the procedure including the risks, benefits and alternatives for the proposed anesthesia with the patient or authorized representative who has indicated his/her understanding and acceptance.       Plan Discussed with: CRNA  Anesthesia Plan Comments:         Anesthesia Quick Evaluation

## 2018-06-07 NOTE — H&P (Signed)
Maria Bellows, MD [address], [place], [place], [place], [address] [address], [place], [place], [place], [address] Phone: [phone]  Fax: [phone]  Primary Care Physician:  Maria Cara, NP   Pre-Procedure History & Physical: HPI:  Maria Mullins is a 53 y.o. female is here for an colonoscopy.   Past Medical History:  Diagnosis Date  . Hypertension     Past Surgical History:  Procedure Laterality Date  . APPENDECTOMY    . c sections    . COLONOSCOPY WITH PROPOFOL N/A 06/19/2017   Procedure: COLONOSCOPY WITH PROPOFOL;  Surgeon: Maria Bellows, MD;  Location: [facility] ENDOSCOPY;  Service: Gastroenterology;  Laterality: N/A;  . TUBAL LIGATION      Prior to Admission medications   Medication Sig Start Date End Date Taking? Authorizing Provider  amLODipine (NORVASC) 10 MG tablet Take by mouth.   Yes [provider]  aspirin EC 81 MG tablet Take 81 mg by mouth daily.   Yes [provider]  atorvastatin (LIPITOR) 80 MG tablet  10/02/17  Yes [provider]  ATORVASTATIN CALCIUM PO Take 1 tablet by mouth daily.   Yes [provider]  chlorthalidone (HYGROTON) 25 MG tablet chlorthalidone 25 mg tablet 07/29/14  Yes [provider]  cyclobenzaprine (FLEXERIL) 10 MG tablet Take 1 tablet (10 mg total) by mouth 3 (three) times daily as needed for muscle spasms. 04/22/18  Yes Norval Gable, MD  ferrous sulfate 325 (65 FE) MG tablet Take 325 mg by mouth daily with breakfast.   Yes [provider]  hydrALAZINE (APRESOLINE) 25 MG tablet  07/03/17  Yes [provider]  hydrALAZINE (APRESOLINE) 50 MG tablet  09/04/17  Yes [provider]  HYDROcodone-acetaminophen (NORCO/VICODIN) 5-325 MG tablet 1-2 tabs po bid prn 04/22/18  Yes Conty, Orlando, MD  losartan (COZAAR) 25 MG tablet Take 25 mg by mouth every morning.   Yes [provider]  meloxicam (MOBIC) 15 MG tablet Take 1 tablet (15 mg total) by mouth  daily. 04/22/18  Yes Norval Gable, MD  mesalamine (APRISO) 0.375 g 24 hr capsule Take 4 capsules (1.5 g total) by mouth daily. 07/11/17  Yes Maria Bellows, MD  metoprolol succinate (TOPROL-XL) 25 MG 24 hr tablet  05/29/17  Yes [provider]  diazepam (VALIUM) 2 MG tablet Take 1 tablet (2 mg total) by mouth every 6 (six) hours as needed for anxiety. Patient not taking: Reported on 07/11/2017 06/25/17   Loney Hering, MD    Allergies as of 04/09/2018 - Review Complete 07/11/2017  Allergen Reaction Noted  . Lisinopril Swelling 01/28/2014    Family History  Problem Relation Age of Onset  . Breast cancer Neg Hx     Social History   Socioeconomic History  . Marital status: Single    Spouse name: Not on file  . Number of children: Not on file  . Years of education: Not on file  . Highest education level: Not on file  Occupational History  . Not on file  Social Needs  . Financial resource strain: Not on file  . Food insecurity:    Worry: Not on file    Inability: Not on file  . Transportation needs:    Medical: Not on file    Non-medical: Not on file  Tobacco Use  . Smoking status: Never Smoker  . Smokeless tobacco: Never Used  Substance and Sexual Activity  . Alcohol use: No  . Drug use: No  .  Sexual activity: Yes  Lifestyle  . Physical activity:    Days per week: Not on file    Minutes per session: Not on file  . Stress: Not on file  Relationships  . Social connections:    Talks on phone: Not on file    Gets together: Not on file    Attends religious service: Not on file    Active member of club or organization: Not on file    Attends meetings of clubs or organizations: Not on file    Relationship status: Not on file  . Intimate partner violence:    Fear of current or ex partner: Not on file    Emotionally abused: Not on file    Physically abused: Not on file    Forced sexual activity: Not on file  Other Topics Concern  . Not on file  Social History  Narrative  . Not on file    Review of Systems: See HPI, otherwise negative ROS  Physical Exam: BP (!) 146/100   Pulse 68   Temp (!) 96.4 F (35.8 C)   Resp 16   Ht 5' 4"  (1.626 m)   Wt 103.4 kg   SpO2 98%   BMI 39.14 kg/m  General:   Alert,  pleasant and cooperative in NAD Head:  Normocephalic and atraumatic. Neck:  Supple; no masses or thyromegaly. Lungs:  Clear throughout to auscultation, normal respiratory effort.    Heart:  +S1, +S2, Regular rate and rhythm, No edema. Abdomen:  Soft, nontender and nondistended. Normal bowel sounds, without guarding, and without rebound.   Neurologic:  Alert and  oriented x4;  grossly normal neurologically.  Impression/Plan: Maria Mullins is here for an colonoscopy to be performed for  Colitis.  Risks, benefits, limitations, and alternatives regarding  colonoscopy have been reviewed with the patient.  Questions have been answered.  All parties agreeable.   Maria Bellows, MD  06/07/2018, 10:28 AM

## 2018-06-07 NOTE — Anesthesia Postprocedure Evaluation (Signed)
Anesthesia Post Note  Patient: Maria Mullins  Procedure(s) Performed: COLONOSCOPY WITH PROPOFOL (N/A )  Patient location during evaluation: Endoscopy Anesthesia Type: General Level of consciousness: awake and alert Pain management: pain level controlled Vital Signs Assessment: post-procedure vital signs reviewed and stable Respiratory status: spontaneous breathing, nonlabored ventilation, respiratory function stable and patient connected to nasal cannula oxygen Cardiovascular status: blood pressure returned to baseline and stable Postop Assessment: no apparent nausea or vomiting Anesthetic complications: no     Last Vitals:  Vitals:   06/07/18 1120 06/07/18 1130  BP: (!) 166/105 (!) 161/90  Pulse: 84 67  Resp: (!) 25 19  Temp:    SpO2: 99% 98%    Last Pain:  Vitals:   06/07/18 1101  TempSrc: Tympanic  PainSc:                  Jayven Naill S

## 2018-06-07 NOTE — Anesthesia Post-op Follow-up Note (Signed)
Anesthesia QCDR form completed.        

## 2018-06-07 NOTE — Transfer of Care (Signed)
Immediate Anesthesia Transfer of Care Note  Patient: Maria Mullins  Procedure(s) Performed: Procedure(s): COLONOSCOPY WITH PROPOFOL (N/A)  Patient Location: PACU and Endoscopy Unit  Anesthesia Type:General  Level of Consciousness: sedated  Airway & Oxygen Therapy: Patient Spontanous Breathing and Patient connected to nasal cannula oxygen  Post-op Assessment: Report given to RN and Post -op Vital signs reviewed and stable  Post vital signs: Reviewed and stable  Last Vitals:  Vitals:   06/07/18 1050 06/07/18 1101  BP: (!) 161/108 (!) 162/85  Pulse: 91   Resp: 17 17  Temp: (!) 36.1 C (!) 36.1 C  SpO2: 395% 320%    Complications: No apparent anesthesia complications Immediate Anesthesia Transfer of Care Note  Patient: Maria Mullins  Procedure(s) Performed: Procedure(s): COLONOSCOPY WITH PROPOFOL (N/A)  Patient Location: PACU and Endoscopy Unit  Anesthesia Type:General  Level of Consciousness: sedated  Airway & Oxygen Therapy: Patient Spontanous Breathing and Patient connected to nasal cannula oxygen  Post-op Assessment: Report given to RN and Post -op Vital signs reviewed and stable  Post vital signs: Reviewed and stable  Last Vitals:  Vitals:   06/07/18 1050 06/07/18 1101  BP: (!) 161/108 (!) 162/85  Pulse: 91   Resp: 17 17  Temp: (!) 36.1 C (!) 36.1 C  SpO2: 233% 435%    Complications: No apparent anesthesia complications

## 2018-06-07 NOTE — Anesthesia Procedure Notes (Signed)
Date/Time: 06/07/2018 10:40 AM Performed by: Doreen Salvage, CRNA Oxygen Delivery Method: Supernova nasal CPAP

## 2018-06-07 NOTE — Anesthesia Procedure Notes (Signed)
Performed by: Doreen Salvage, CRNA Pre-anesthesia Checklist: Patient identified, Emergency Drugs available, Suction available and Patient being monitored Patient Re-evaluated:Patient Re-evaluated prior to induction Oxygen Delivery Method: Nasal cannula Induction Type: IV induction Dental Injury: Teeth and Oropharynx as per pre-operative assessment  Comments: Nasal cannula with etCO2 monitoring

## 2018-06-08 ENCOUNTER — Encounter: Payer: Self-pay | Admitting: Gastroenterology

## 2018-06-11 LAB — SURGICAL PATHOLOGY

## 2018-06-21 ENCOUNTER — Encounter: Payer: Self-pay | Admitting: Gastroenterology

## 2018-06-24 ENCOUNTER — Telehealth: Payer: Self-pay

## 2018-06-24 NOTE — Telephone Encounter (Signed)
Spoke with pt and informed her of biopsy results and Dr. Georgeann Oppenheim instructions for pt to continue the apriso and to follow up with him in 4 weeks via E-visit. Pt agrees.

## 2018-06-24 NOTE — Telephone Encounter (Signed)
-----   Message from Jonathon Bellows, MD sent at 06/21/2018  7:55 AM EDT ----- Maria Mullins inform biopsies show that she has ulcerative colitis - mild at his time. Continue taking the aprisio- will discuss further at next office visit which can be e visit in 4 weeks if the virus continues. If has any symptoms to call; [place] back  Dr Jonathon Bellows MD,MRCP [facility]) Gastroenterology/Hepatology Pager: [phone]

## 2018-07-09 ENCOUNTER — Ambulatory Visit: Payer: BLUE CROSS/BLUE SHIELD | Admitting: Gastroenterology

## 2018-07-15 ENCOUNTER — Ambulatory Visit (INDEPENDENT_AMBULATORY_CARE_PROVIDER_SITE_OTHER): Payer: BLUE CROSS/BLUE SHIELD | Admitting: Gastroenterology

## 2018-07-15 DIAGNOSIS — K51919 Ulcerative colitis, unspecified with unspecified complications: Secondary | ICD-10-CM | POA: Diagnosis not present

## 2018-07-15 DIAGNOSIS — R748 Abnormal levels of other serum enzymes: Secondary | ICD-10-CM | POA: Diagnosis not present

## 2018-07-15 NOTE — Progress Notes (Signed)
Jonathon Bellows , MD [address]  [place]  [place], [place] [address]  Main: [phone]  Fax: [phone]   Primary Care Physician: Elisabeth Cara, NP  Virtual Visit via Telephone Note  I connected with patient on 07/15/18 at  8:30 AM EDT by telephone and verified that I am speaking with the correct person using two identifiers.   I discussed the limitations, risks, security and privacy concerns of performing an evaluation and management service by telephone and the availability of in person appointments. I also discussed with the patient that there may be a patient responsible charge related to this service. The patient expressed understanding and agreed to proceed.  Location of Patient: Home Location of Provider: Home Persons involved: Patient and provider only   History of Present Illness: Chief Complaint  Patient presents with  . Follow-up    Ulcerative colitis    HPI: Maria Mullins is a 53 y.o. female   Summary of history :  She is here today to follow up to her last visit back in 04/2018 . She had on 06/19/17 a colonoscopy  for colon cancer screening . I noticed decreased vascular pattern in the transverse and descending colon. Biopsy results demonstrated chronic colitis with focal cryptitis extending from rectum to the transverse colon. No diarrhea , none in the past , no rectal bleeding . No family history of IBD. She takes asprin 81 mg a day , no other NSAID's. One bowel movement in the morning , formed, no cramping.  Immune to hep A , required vaccine for hep B. GGT normal. Viral hepatitis negative, Normal transaminases- elevated smooth muscle antibody.    Interval history 04/08/2018-07/15/2018  04/2018: AMA and smooth muscle antibody were negative, elevated calcium and alk phos. Varicella zoster IGG negative, CRP 1   06/07/2018 : Colonoscopy :decreased vascular markings in sigmoid,descending and transverse colon. Bx showed mild/moderate  active  colitis with features of chronicity.   She is doing well. She had surgery on her parathyroid. She says everything went well. She says her calcium is back to normal.   Taking Aprisio 4 tablets a day .Normal formed non bloody bowel movements . No blood in the stool. 2 bowel movements, no rectal bleeding. No NSAID's, no smoking . She has had her Hepatitis B shots, due for the second on the 15th.      Current Outpatient Medications  Medication Sig Dispense Refill  . amLODipine (NORVASC) 10 MG tablet Take by mouth.    Marland Kitchen aspirin EC 81 MG tablet Take 81 mg by mouth daily.    Marland Kitchen atorvastatin (LIPITOR) 80 MG tablet   0  . ATORVASTATIN CALCIUM PO Take 1 tablet by mouth daily.    . chlorthalidone (HYGROTON) 25 MG tablet chlorthalidone 25 mg tablet    . hydrALAZINE (APRESOLINE) 25 MG tablet   1  . hydrALAZINE (APRESOLINE) 50 MG tablet   1  . losartan (COZAAR) 25 MG tablet Take 25 mg by mouth every morning.    . mesalamine (APRISO) 0.375 g 24 hr capsule Take 4 capsules (1.5 g total) by mouth daily. 240 capsule 3  . metoprolol succinate (TOPROL-XL) 25 MG 24 hr tablet   0  . cyclobenzaprine (FLEXERIL) 10 MG tablet Take 1 tablet (10 mg total) by mouth 3 (three) times daily as needed for muscle spasms. (Patient not taking: Reported on 07/11/2018) 30 tablet 0  . diazepam (VALIUM) 2 MG tablet Take 1 tablet (2 mg total) by mouth every 6 (  six) hours as needed for anxiety. (Patient not taking: Reported on 07/11/2017) 12 tablet 0  . ferrous sulfate 325 (65 FE) MG tablet Take 325 mg by mouth daily with breakfast.    . HYDROcodone-acetaminophen (NORCO/VICODIN) 5-325 MG tablet 1-2 tabs po bid prn (Patient not taking: Reported on 07/11/2018) 6 tablet 0  . meloxicam (MOBIC) 15 MG tablet Take 1 tablet (15 mg total) by mouth daily. (Patient not taking: Reported on 07/11/2018) 30 tablet 0   No current facility-administered medications for this visit.     Allergies as of 07/15/2018 - Review Complete 07/11/2018  Allergen  Reaction Noted  . Lisinopril Swelling 01/28/2014    Review of Systems:    All systems reviewed and negative except where noted in HPI.   Observations/Objective:  Labs: CMP     Component Value Date/Time   NA 140 04/08/2018 1520   NA 142 11/02/2011 0538   K 4.6 04/08/2018 1520   K 3.0 (L) 11/02/2011 0538   CL 106 04/08/2018 1520   CL 107 11/02/2011 0538   CO2 26 04/08/2018 1520   CO2 25 11/02/2011 0538   GLUCOSE 101 (H) 04/08/2018 1520   GLUCOSE 142 (H) 11/25/2016 2357   GLUCOSE 127 (H) 11/02/2011 0538   BUN 9 04/08/2018 1520   BUN 8 11/02/2011 0538   CREATININE 0.95 04/08/2018 1520   CREATININE 0.84 11/02/2011 0538   CALCIUM 12.2 (H) 04/08/2018 1520   CALCIUM 8.7 11/02/2011 0538   PROT 6.8 04/08/2018 1520   PROT 8.2 11/02/2011 0538   ALBUMIN 4.2 04/08/2018 1520   ALBUMIN 3.8 11/02/2011 0538   AST 24 04/08/2018 1520   AST 35 11/02/2011 0538   ALT 31 04/08/2018 1520   ALT 25 11/02/2011 0538   ALKPHOS 168 (H) 04/08/2018 1520   ALKPHOS 142 (H) 11/02/2011 0538   BILITOT 0.4 04/08/2018 1520   BILITOT 0.4 11/02/2011 0538   GFRNONAA 70 04/08/2018 1520   GFRNONAA >60 11/02/2011 0538   GFRAA 80 04/08/2018 1520   GFRAA >60 11/02/2011 0538   Lab Results  Component Value Date   WBC 9.0 03/06/2018   HGB 12.6 03/06/2018   HCT 39.3 03/06/2018   MCV 86 03/06/2018   PLT 359 03/06/2018    Imaging Studies: No results found.  Assessment and Plan:   Maria Mullins is a 54 y.o. y/o femalehere to follow up for possible ulcerative colitis diganosed incidentally during her routine screening colonoscopy. She is asymptomatic but her colon and biopsies show features suggestive of ulcerative colitis..Been on Aprisio since 07/2017 .   Clinically and biochemically she is in remission.  Endoscopically there is mild to moderate areas of inflammation.  I discussed with her options which would include stepping up of therapy to a medication such as Stelara as we do know that chronic  inflammation can increase the risk of colon cancer and hence my suggestion would be probably to escalate therapy.  Option 2 would be to not do anything which comes at its associated risks and option 3 is to reevaluate the colon in 6 months or maybe even a year to determine if the inflammation still persists which is probably would.  I informed her that I will provide her information on Stelara to read at home and we will discuss further at her next office visit regarding her options in which when she would choose to go forward with.  At the next office visit I will catch up with her health maintenance, vaccinations, labs.  Follow Up Instructions:  F/u in 4 months in the office .    I discussed the assessment and treatment plan with the patient. The patient was provided an opportunity to ask questions and all were answered. The patient agreed with the plan and demonstrated an understanding of the instructions.   The patient was advised to call back or seek an in-person evaluation if the symptoms worsen or if the condition fails to improve as anticipated.  I provided 84mnutes of non-face-to-face time during this encounter.  Dr KJonathon BellowsMD,MRCP ([facility] Gastroenterology/Hepatology Pager: [phone]  Speech recognition software was used to dictate this note.

## 2018-07-18 ENCOUNTER — Telehealth: Payer: Self-pay | Admitting: Gastroenterology

## 2018-07-18 NOTE — Telephone Encounter (Signed)
Patient called about  mesalamine (APRISO) 0.375 g 24 hr capsule she was taking 4 pills a day & then beforeher colonoscopy she was told to take 2 pills. She has continued with the two & in the Video visit he ask if she was still taking the 4. Her answer was yes,but that was not correct. She needs to know if she should begin the 4 a day or stay @ 2.  After speaking with Dr Vicente Males about her colonoscopy she was told to stay @ home as much as possible with the results from the colonoscopy.She would like a note stating this for her employer. You can send  to her e-mail  [email or web address].

## 2018-07-19 NOTE — Telephone Encounter (Signed)
Spoke with pt and informed her of Dr. Georgeann Oppenheim directions for the [facility] as well as Dr. Georgeann Oppenheim suggestions about the COVID-19 precautions. Pt understands.

## 2018-07-19 NOTE — Telephone Encounter (Signed)
She needs to be on 4 tablets and regarding COVID- the recommendations are the same as in general- wash hands, avoid crowded places, wear a face mask. If sick stay at home. We are all trying to stay at home as much as possible and the ame would apply top her- no different . These are general recommendations and not different from anyone else at this time. Not sure how she would like this put on a letter?Marland KitchenThese are the general CDC recomemndations

## 2018-11-25 ENCOUNTER — Other Ambulatory Visit: Payer: Self-pay | Admitting: Family Medicine

## 2018-11-25 DIAGNOSIS — Z1231 Encounter for screening mammogram for malignant neoplasm of breast: Secondary | ICD-10-CM

## 2018-12-19 ENCOUNTER — Other Ambulatory Visit: Payer: Self-pay | Admitting: Gastroenterology

## 2018-12-19 DIAGNOSIS — K51919 Ulcerative colitis, unspecified with unspecified complications: Secondary | ICD-10-CM

## 2018-12-20 ENCOUNTER — Other Ambulatory Visit: Payer: Self-pay

## 2018-12-20 DIAGNOSIS — K51919 Ulcerative colitis, unspecified with unspecified complications: Secondary | ICD-10-CM

## 2018-12-20 MED ORDER — MESALAMINE ER 0.375 G PO CP24: 1500.0000 mg | ORAL_CAPSULE | Freq: Every day | ORAL | 5 refills | Status: DC

## 2019-01-08 ENCOUNTER — Other Ambulatory Visit: Payer: Self-pay

## 2019-01-08 ENCOUNTER — Ambulatory Visit
Admission: RE | Admit: 2019-01-08 | Discharge: 2019-01-08 | Disposition: A | Discharge status: Home / Self Care | Payer: BLUE CROSS/BLUE SHIELD | Source: Ambulatory Visit | Attending: Family Medicine | Admitting: Family Medicine

## 2019-01-08 DIAGNOSIS — Z1231 Encounter for screening mammogram for malignant neoplasm of breast: Secondary | ICD-10-CM

## 2019-06-20 ENCOUNTER — Ambulatory Visit: Payer: BLUE CROSS/BLUE SHIELD | Attending: Internal Medicine

## 2019-06-20 DIAGNOSIS — Z23 Encounter for immunization: Secondary | ICD-10-CM

## 2019-06-20 NOTE — Progress Notes (Signed)
   Covid-19 Vaccination Clinic  Name:  ANYRA KAUFMAN    MRN: [id number] DOB: 10-Aug-1965  06/20/2019  Ms. Hata was observed post Covid-19 immunization for 15 minutes without incident. She was provided with Vaccine Information Sheet and instruction to access the V-Safe system.   Ms. Peplinski was instructed to call 911 with any severe reactions post vaccine: Marland Kitchen Difficulty breathing  . Swelling of face and throat  . A fast heartbeat  . A bad rash all over body  . Dizziness and weakness   Immunizations Administered    Name Date Dose VIS Date Route   Pfizer COVID-19 Vaccine 06/20/2019  3:03 PM 0.3 mL 03/14/2019 Intramuscular   Manufacturer: [place]   Lot: [id number]   [place]: [id number]

## 2019-07-11 ENCOUNTER — Ambulatory Visit: Payer: BLUE CROSS/BLUE SHIELD | Attending: Internal Medicine

## 2019-07-11 DIAGNOSIS — Z23 Encounter for immunization: Secondary | ICD-10-CM

## 2019-07-11 NOTE — Progress Notes (Signed)
   Covid-19 Vaccination Clinic  Name:  Maria Mullins    MRN: [id number] DOB: [birth year]  07/11/2019  Maria Mullins was observed post Covid-19 immunization for 30 minutes based on pre-vaccination screening without incident. She was provided with Vaccine Information Sheet and instruction to access the V-Safe system.   Maria Mullins was instructed to call 911 with any severe reactions post vaccine: Marland Kitchen Difficulty breathing  . Swelling of face and throat  . A fast heartbeat  . A bad rash all over body  . Dizziness and weakness   Immunizations Administered    Name Date Dose VIS Date Route   Pfizer COVID-19 Vaccine 07/11/2019  2:20 PM 0.3 mL 03/14/2019 Intramuscular   Manufacturer: [place]   Lot: [id number]   [place]: [id number]

## 2019-08-02 ENCOUNTER — Other Ambulatory Visit: Payer: Self-pay | Admitting: Gastroenterology

## 2019-08-02 DIAGNOSIS — K51919 Ulcerative colitis, unspecified with unspecified complications: Secondary | ICD-10-CM

## 2019-08-05 NOTE — Telephone Encounter (Signed)
1 refill only and needs office visit not virtual

## 2019-10-12 ENCOUNTER — Other Ambulatory Visit: Payer: Self-pay | Admitting: Gastroenterology

## 2019-10-12 DIAGNOSIS — K51919 Ulcerative colitis, unspecified with unspecified complications: Secondary | ICD-10-CM

## 2019-11-25 ENCOUNTER — Other Ambulatory Visit: Payer: Self-pay | Admitting: Gastroenterology

## 2019-11-25 DIAGNOSIS — K51919 Ulcerative colitis, unspecified with unspecified complications: Secondary | ICD-10-CM

## 2019-12-17 ENCOUNTER — Other Ambulatory Visit: Payer: Self-pay | Admitting: Family Medicine

## 2019-12-17 DIAGNOSIS — Z1231 Encounter for screening mammogram for malignant neoplasm of breast: Secondary | ICD-10-CM

## 2020-01-01 ENCOUNTER — Other Ambulatory Visit: Payer: Self-pay | Admitting: Gastroenterology

## 2020-01-01 DIAGNOSIS — K51919 Ulcerative colitis, unspecified with unspecified complications: Secondary | ICD-10-CM

## 2020-01-09 ENCOUNTER — Ambulatory Visit
Admission: RE | Admit: 2020-01-09 | Discharge: 2020-01-09 | Disposition: A | Discharge status: Home / Self Care | Payer: BLUE CROSS/BLUE SHIELD | Source: Ambulatory Visit | Attending: Family Medicine | Admitting: Family Medicine

## 2020-01-09 ENCOUNTER — Other Ambulatory Visit: Payer: Self-pay

## 2020-01-09 DIAGNOSIS — Z1231 Encounter for screening mammogram for malignant neoplasm of breast: Secondary | ICD-10-CM

## 2020-02-03 ENCOUNTER — Other Ambulatory Visit: Payer: Self-pay

## 2020-02-03 ENCOUNTER — Ambulatory Visit: Payer: BLUE CROSS/BLUE SHIELD | Admitting: Gastroenterology

## 2020-02-07 ENCOUNTER — Other Ambulatory Visit: Payer: Self-pay | Admitting: Gastroenterology

## 2020-02-07 DIAGNOSIS — K51919 Ulcerative colitis, unspecified with unspecified complications: Secondary | ICD-10-CM

## 2020-02-13 ENCOUNTER — Other Ambulatory Visit: Payer: Self-pay | Admitting: Gastroenterology

## 2020-02-13 DIAGNOSIS — K51919 Ulcerative colitis, unspecified with unspecified complications: Secondary | ICD-10-CM

## 2020-02-17 ENCOUNTER — Other Ambulatory Visit: Payer: Self-pay | Admitting: Gastroenterology

## 2020-02-17 DIAGNOSIS — K51919 Ulcerative colitis, unspecified with unspecified complications: Secondary | ICD-10-CM

## 2020-02-20 NOTE — Telephone Encounter (Signed)
This medication needs monitoring with a CBC , LFT's - since not seen for over a year and cannot see due to insurance issues- unable to refill- she would need to discuss with PCP who could consider refilling and monitoring   Dr Vicente Males

## 2021-06-09 IMAGING — MG DIGITAL SCREENING BILAT W/ TOMO W/ CAD
8 series · 8 of 24 positions shown · non-contrast
Comparison: Previous exam(s).

CLINICAL DATA: Screening.

EXAM:
DIGITAL SCREENING BILATERAL MAMMOGRAM WITH TOMO AND CAD

[R MLO synth-2D]
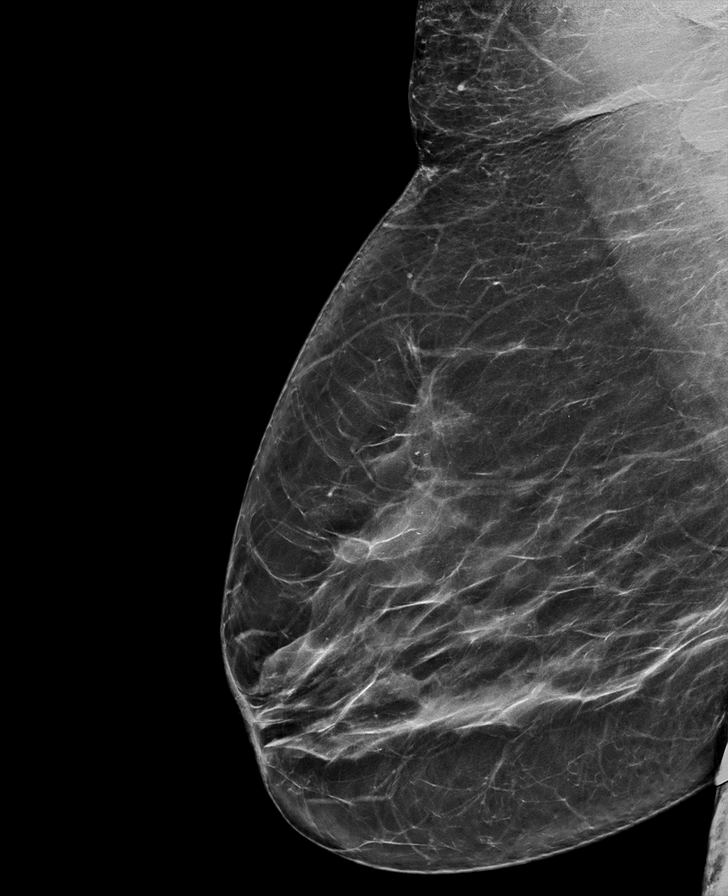

[L CC synth-2D]
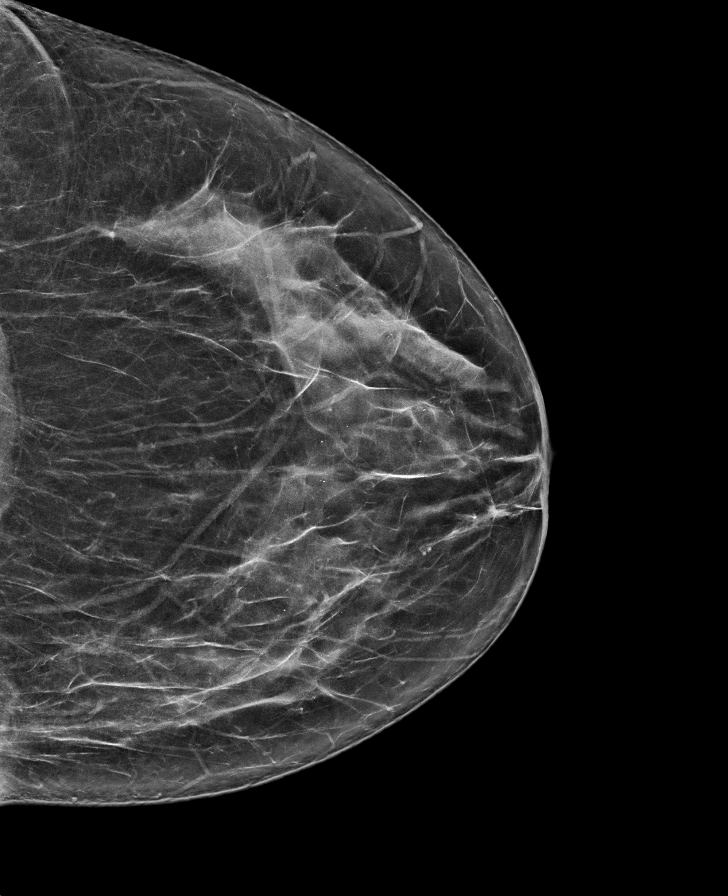

[R CC synth-2D]
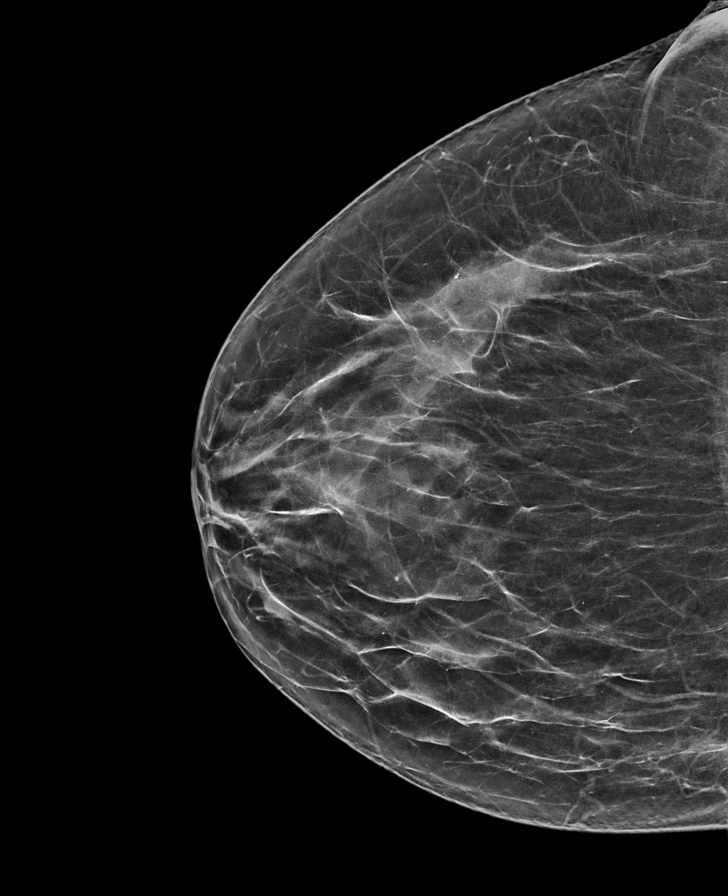

[L MLO synth-2D]
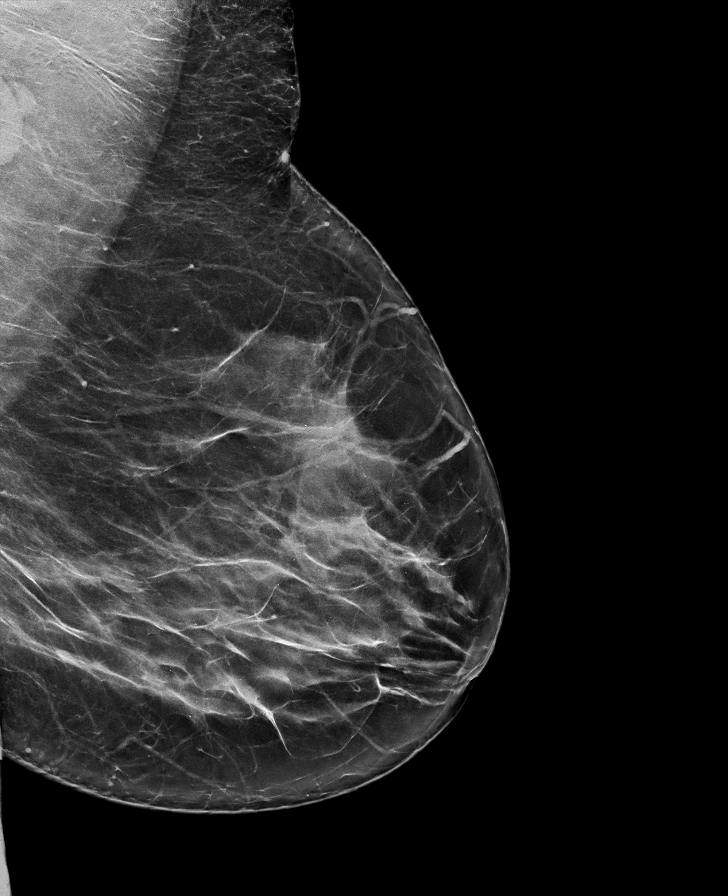

[R CC tomo · tomo slice 35/70.0]
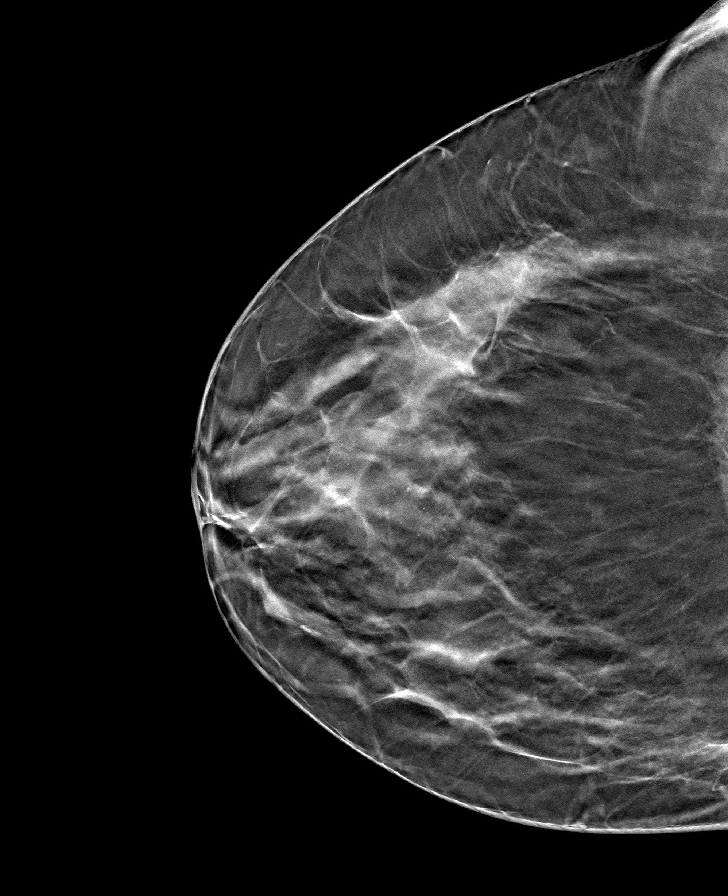

[R MLO tomo · tomo slice 42/83.0]
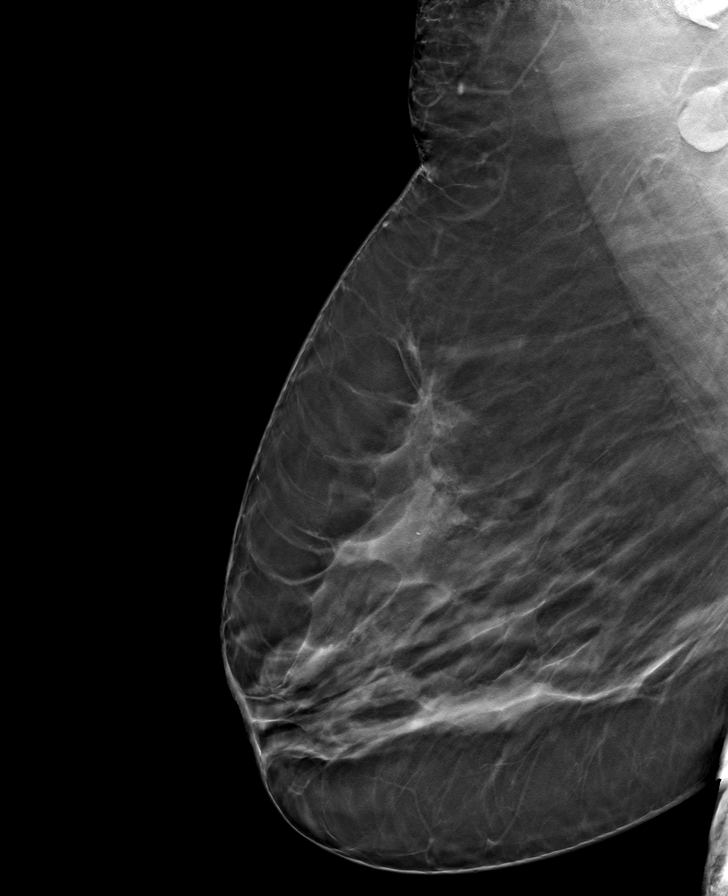

[L MLO tomo · tomo slice 43/84.0]
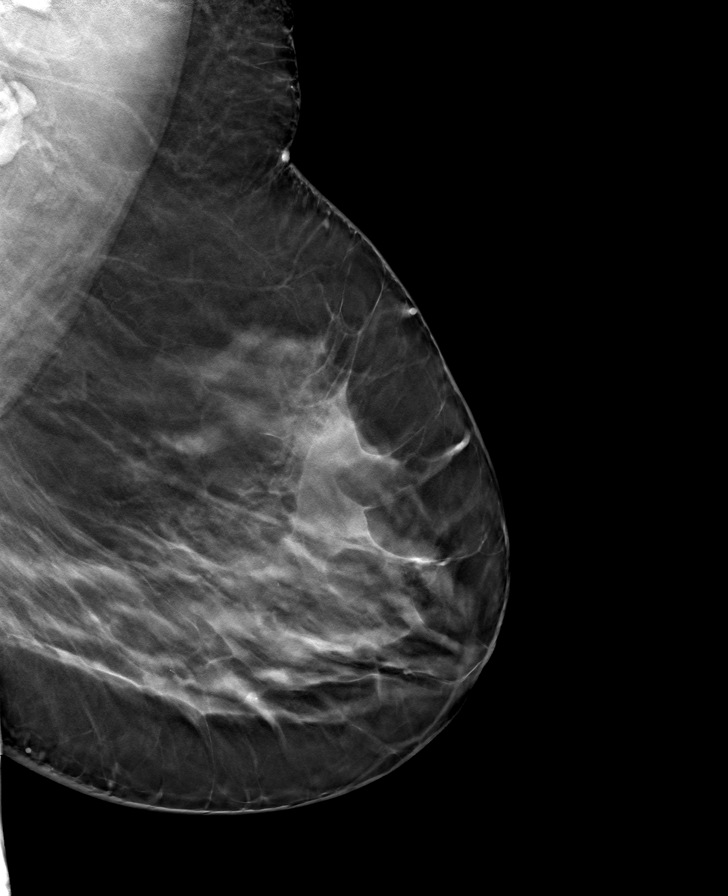

[L CC tomo · tomo slice 37/73.0]
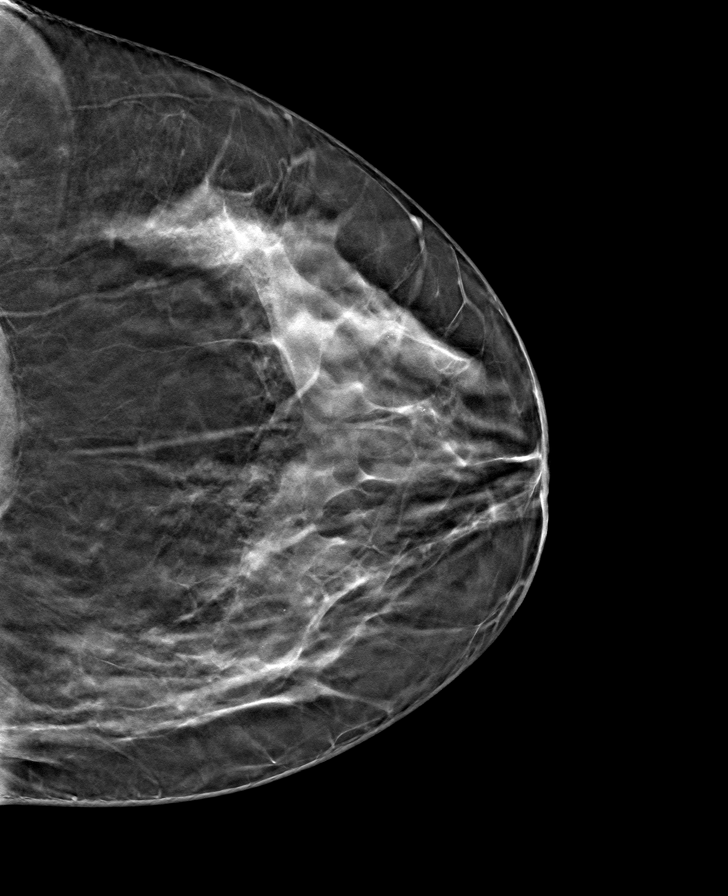

[8 of 24 positions shown; findings below may reference images not displayed]

ACR Breast Density Category c: The breast tissue is heterogeneously
dense, which may obscure small masses.
FINDINGS: There are no findings suspicious for malignancy. Images were
processed with CAD.
IMPRESSION: No mammographic evidence of malignancy. A result letter of this
screening mammogram will be mailed directly to the patient.

RECOMMENDATION:
Screening mammogram in one year. (Code:[id number])

BI-RADS CATEGORY  1: Negative.

## 2022-03-03 ENCOUNTER — Other Ambulatory Visit: Payer: Self-pay | Admitting: Family Medicine

## 2022-03-03 DIAGNOSIS — Z1231 Encounter for screening mammogram for malignant neoplasm of breast: Secondary | ICD-10-CM
